# Patient Record
Sex: Male | Born: 1935 | Race: Black or African American | Hispanic: No | Smoking: Never smoker
Health system: Southern US, Community
[De-identification: ages and names within clinical notes are randomized; demographics above are authoritative.]

## PROBLEM LIST (undated history)

## (undated) DIAGNOSIS — I639 Cerebral infarction, unspecified: Secondary | ICD-10-CM

## (undated) DIAGNOSIS — H409 Unspecified glaucoma: Secondary | ICD-10-CM

---

## 1998-01-10 ENCOUNTER — Ambulatory Visit (HOSPITAL_BASED_OUTPATIENT_CLINIC_OR_DEPARTMENT_OTHER): Admission: RE | Admit: 1998-01-10 | Discharge: 1998-01-10 | Payer: Self-pay | Admitting: Orthopedic Surgery

## 1998-12-06 ENCOUNTER — Ambulatory Visit (HOSPITAL_COMMUNITY): Admission: RE | Admit: 1998-12-06 | Discharge: 1998-12-06 | Payer: Self-pay | Admitting: *Deleted

## 1998-12-24 ENCOUNTER — Ambulatory Visit (HOSPITAL_COMMUNITY): Admission: RE | Admit: 1998-12-24 | Discharge: 1998-12-24 | Payer: Self-pay | Admitting: *Deleted

## 1999-01-10 ENCOUNTER — Ambulatory Visit (HOSPITAL_COMMUNITY): Admission: RE | Admit: 1999-01-10 | Discharge: 1999-01-10 | Payer: Self-pay | Admitting: *Deleted

## 2000-03-31 ENCOUNTER — Ambulatory Visit (HOSPITAL_COMMUNITY): Admission: RE | Admit: 2000-03-31 | Discharge: 2000-03-31 | Payer: Self-pay | Admitting: *Deleted

## 2000-03-31 ENCOUNTER — Encounter: Payer: Self-pay | Admitting: *Deleted

## 2000-06-22 ENCOUNTER — Ambulatory Visit (HOSPITAL_COMMUNITY): Admission: RE | Admit: 2000-06-22 | Discharge: 2000-06-22 | Payer: Self-pay | Admitting: Interventional Cardiology

## 2000-06-23 ENCOUNTER — Encounter: Payer: Self-pay | Admitting: Interventional Cardiology

## 2000-06-23 ENCOUNTER — Inpatient Hospital Stay (HOSPITAL_COMMUNITY): Admission: EM | Admit: 2000-06-23 | Discharge: 2000-06-27 | Payer: Self-pay | Admitting: Emergency Medicine

## 2000-06-25 ENCOUNTER — Encounter: Payer: Self-pay | Admitting: Interventional Cardiology

## 2001-11-04 ENCOUNTER — Ambulatory Visit (HOSPITAL_COMMUNITY): Admission: RE | Admit: 2001-11-04 | Discharge: 2001-11-05 | Payer: Self-pay | Admitting: Internal Medicine

## 2002-02-16 ENCOUNTER — Ambulatory Visit (HOSPITAL_COMMUNITY): Admission: RE | Admit: 2002-02-16 | Discharge: 2002-02-16 | Payer: Self-pay | Admitting: Internal Medicine

## 2002-04-05 ENCOUNTER — Encounter: Payer: Self-pay | Admitting: *Deleted

## 2002-04-05 ENCOUNTER — Ambulatory Visit (HOSPITAL_COMMUNITY): Admission: RE | Admit: 2002-04-05 | Discharge: 2002-04-05 | Payer: Self-pay | Admitting: *Deleted

## 2008-06-14 ENCOUNTER — Encounter (INDEPENDENT_AMBULATORY_CARE_PROVIDER_SITE_OTHER): Payer: Self-pay | Admitting: *Deleted

## 2008-06-14 ENCOUNTER — Encounter: Admission: RE | Admit: 2008-06-14 | Discharge: 2008-06-14 | Payer: Self-pay | Admitting: Internal Medicine

## 2009-04-15 ENCOUNTER — Inpatient Hospital Stay (HOSPITAL_COMMUNITY): Admission: EM | Admit: 2009-04-15 | Discharge: 2009-04-22 | Payer: Self-pay | Admitting: Emergency Medicine

## 2009-04-15 ENCOUNTER — Encounter: Admission: RE | Admit: 2009-04-15 | Discharge: 2009-04-15 | Payer: Self-pay | Admitting: Internal Medicine

## 2009-04-16 ENCOUNTER — Encounter (INDEPENDENT_AMBULATORY_CARE_PROVIDER_SITE_OTHER): Payer: Self-pay

## 2009-06-07 ENCOUNTER — Ambulatory Visit (HOSPITAL_COMMUNITY): Admission: RE | Admit: 2009-06-07 | Discharge: 2009-06-07 | Payer: Self-pay | Admitting: Internal Medicine

## 2009-06-10 ENCOUNTER — Inpatient Hospital Stay (HOSPITAL_COMMUNITY): Admission: AD | Admit: 2009-06-10 | Discharge: 2009-06-13 | Payer: Self-pay | Admitting: General Surgery

## 2009-06-10 ENCOUNTER — Encounter: Admission: RE | Admit: 2009-06-10 | Discharge: 2009-06-10 | Payer: Self-pay | Admitting: General Surgery

## 2009-06-10 ENCOUNTER — Encounter (INDEPENDENT_AMBULATORY_CARE_PROVIDER_SITE_OTHER): Payer: Self-pay | Admitting: *Deleted

## 2009-06-12 ENCOUNTER — Encounter (INDEPENDENT_AMBULATORY_CARE_PROVIDER_SITE_OTHER): Payer: Self-pay | Admitting: *Deleted

## 2009-06-13 ENCOUNTER — Encounter (INDEPENDENT_AMBULATORY_CARE_PROVIDER_SITE_OTHER): Payer: Self-pay | Admitting: *Deleted

## 2009-06-28 ENCOUNTER — Ambulatory Visit: Payer: Self-pay | Admitting: Gastroenterology

## 2009-06-28 DIAGNOSIS — K56609 Unspecified intestinal obstruction, unspecified as to partial versus complete obstruction: Secondary | ICD-10-CM | POA: Insufficient documentation

## 2009-06-28 DIAGNOSIS — C61 Malignant neoplasm of prostate: Secondary | ICD-10-CM

## 2009-07-01 ENCOUNTER — Inpatient Hospital Stay (HOSPITAL_COMMUNITY): Admission: EM | Admit: 2009-07-01 | Discharge: 2009-07-03 | Payer: Self-pay | Admitting: Emergency Medicine

## 2009-08-01 ENCOUNTER — Ambulatory Visit (HOSPITAL_COMMUNITY): Admission: RE | Admit: 2009-08-01 | Discharge: 2009-08-01 | Payer: Self-pay | Admitting: General Surgery

## 2010-04-24 IMAGING — CR DG CHEST 2V
2 series · 2 of 2 positions shown · non-contrast
Comparison: 07/02/2009

CLINICAL DATA: Right inguinal hernia, preoperative assessment,
history of hypertension

CHEST - 2 VIEW

[view not recorded (1 of 2)]
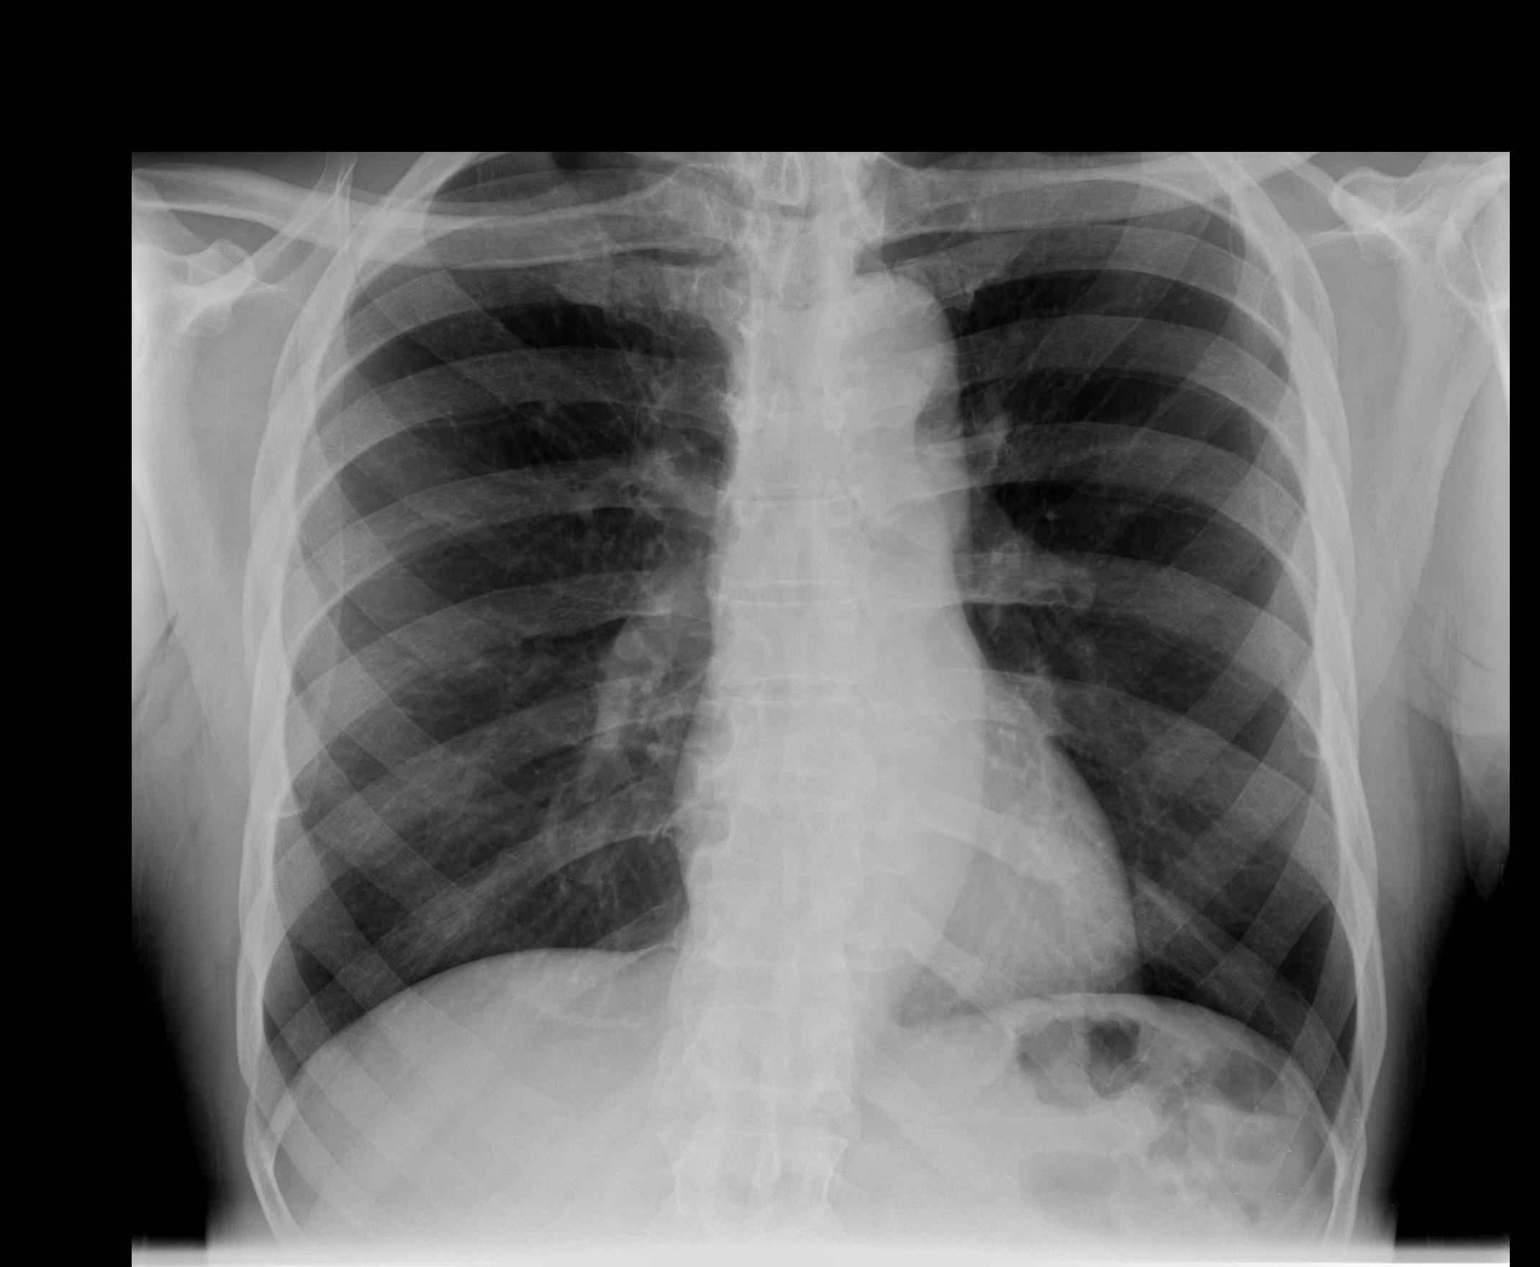

[view not recorded (2 of 2)]
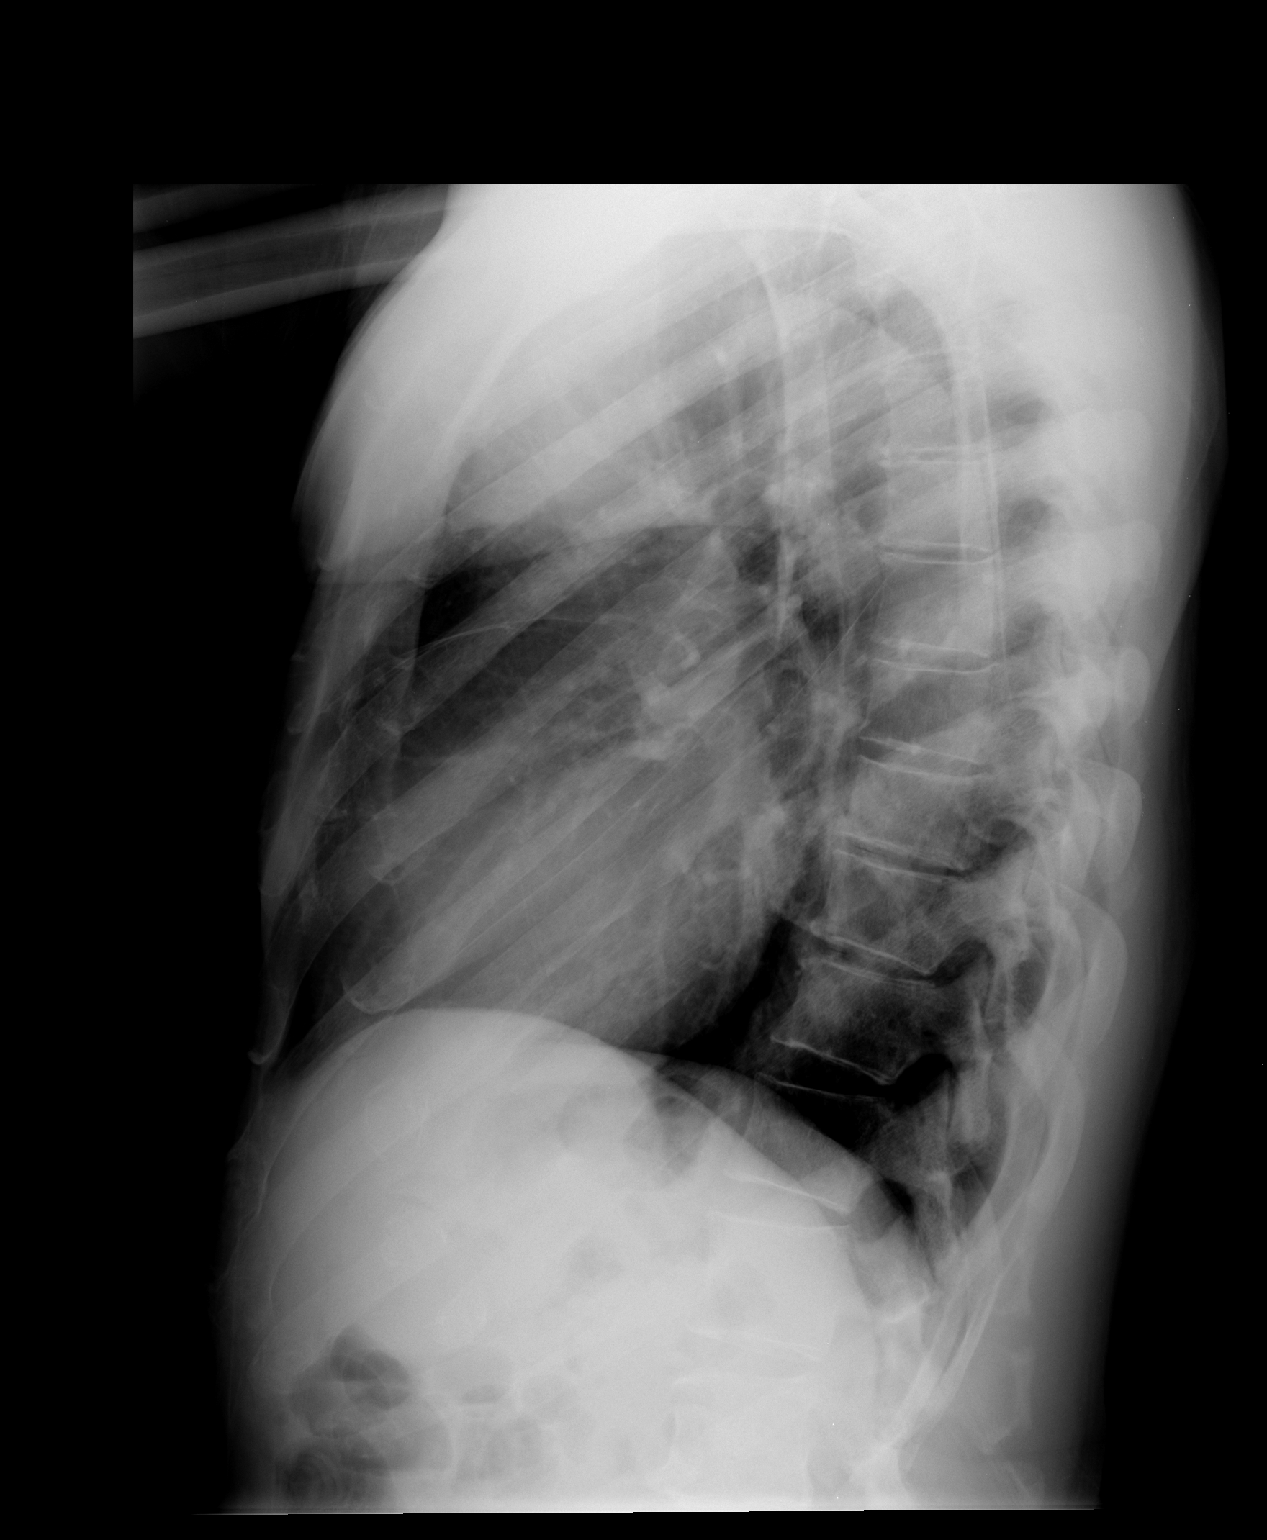

[2 of 2 positions shown; findings below may reference images not displayed]

FINDINGS: Normal heart size and pulmonary vascularity.
Tortuous aorta.
Minimal chronic peribronchial thickening and question underlying
emphysematous changes.
No acute infiltrate or pleural effusion.
No acute bony abnormalities.
IMPRESSION: Chronic bronchitic and probable emphysematous changes, question
COPD.
No acute abnormalities.

## 2010-06-10 NOTE — Letter (Signed)
Summary: New Patient letter  [facility]  [address] [place], [place] [address]   Phone: [phone]  Fax: [phone]       06/10/2009 MRN: [id number]  [facility] [address] [place], [place]  [address]  Dear Mr. Wieck,  Welcome to the Gastroenterology Division at [facility].    You are scheduled to see Dr.  Arlyce Dice on 06-28-09 at 3pm on the 3rd floor at [facility], [address]  We ask that you try to arrive at our office 15 minutes prior to your appointment time to allow for check-in.  We would like you to complete the enclosed self-administered evaluation form prior to your visit and bring it with you on the day of your appointment.  We will review it with you.  Also, please bring a complete list of all your medications or, if you prefer, bring the medication bottles and we will list them.  Please bring your insurance card so that we may make a copy of it.  If your insurance requires a referral to see a specialist, please bring your referral form from your primary care physician.  Co-payments are due at the time of your visit and may be paid by cash, check or credit card.     Your office visit will consist of a consult with your physician (includes a physical exam), any laboratory testing he/she may order, scheduling of any necessary diagnostic testing (e.g. x-ray, ultrasound, CT-scan), and scheduling of a procedure (e.g. Endoscopy, Colonoscopy) if required.  Please allow enough time on your schedule to allow for any/all of these possibilities.    If you cannot keep your appointment, please call [phone] to cancel or reschedule prior to your appointment date.  This allows [place] the opportunity to schedule an appointment for another patient in need of care.  If you do not cancel or reschedule by 5 p.m. the business day prior to your appointment date, you will be charged a $50.00 late cancellation/no-show fee.    Thank you for choosing  [facility] for your medical needs.  We appreciate the opportunity to care for you.  Please visit [place] at our website  to learn more about our practice.                     Sincerely,                                                             The Gastroenterology Division

## 2010-06-10 NOTE — Letter (Signed)
Summary: Results Letter  [facility]  [address] [place], [place] [address]   Phone: [phone]  Fax: [phone]        June 28, 2009 MRN: [id number]    [facility] [address] [place], [place]  [address]    Dear Mario Mendez,  It is my pleasure to have treated you recently as a new patient in my office. I appreciate your confidence and the opportunity to participate in your care.  Since I do have a busy inpatient endoscopy schedule and office schedule, my office hours vary weekly. I am, however, available for emergency calls everyday through my office. If I am not available for an urgent office appointment, another one of our gastroenterologist will be able to assist you.  My well-trained staff are prepared to help you at all times. For emergencies after office hours, a physician from our Gastroenterology section is always available through my 24 hour answering service  Once again I welcome you as a new patient and I look forward to a happy and healthy relationship             Sincerely,  Mario Meckel MD  This letter has been electronically signed by your physician.  Appended Document: Results Letter Letter mailed to patient.

## 2010-06-10 NOTE — Assessment & Plan Note (Signed)
Summary: EPIGASTRIC PAIN/NAUSEA & VOMITTING/YF   History of Present Illness Visit Type: consult  Primary GI MD: Melvia Heaps MD [facility] Primary Provider: Kirby Funk, MD  Requesting Provider: Almond Lint, MD Chief Complaint: Pt was referred for Epigastic pain, nausea and vomiting. Pt states that he is better and denies any GI complaints  History of Present Illness:   Mario Mendez is a pleasant 77 year old Afro-American male referred at the request of Dr.Byerly for GI evaluation.  In December, 2010 he underwent a right hemicolectomy for a complicated  acute appendicitis.  He was hospitalized in early February for a small bowel obstruction.  This was treated medically with resolution.  At this time he has no GI complaints including pain, nausea or vomiting.  He is moving his bowels regularly.   GI Review of Systems    Reports abdominal pain, nausea, and  vomiting.     Location of  Abdominal pain: generalized.    Denies acid reflux, belching, bloating, chest pain, dysphagia with liquids, dysphagia with solids, heartburn, loss of appetite, vomiting blood, weight loss, and  weight gain.      Reports constipation and  diarrhea.     Denies anal fissure, black tarry stools, change in bowel habit, diverticulosis, fecal incontinence, heme positive stool, hemorrhoids, irritable bowel syndrome, jaundice, light color stool, liver problems, rectal bleeding, and  rectal pain.    Current Medications (verified): 1)  Plavix 75 Mg Tabs (Clopidogrel Bisulfate) .... Once Daily 2)  Prilosec Otc 20 Mg Tbec (Omeprazole Magnesium) .... As Needed 3)  Amlodipine Besylate 2.5 Mg Tabs (Amlodipine Besylate) .... One Tablet By Mouth Once Daily 4)  Prednisone 10 Mg Tabs (Prednisone) .... One Tablet By Mouth Once Daily  Allergies (verified): No Known Drug Allergies  Past History:  Past Medical History: Hypertension Arthritis Prostate Cancer---1998  Past Surgical History: Appendectomy Hernia Surgery Colon  Surgery   Family History: No FH of Colon Cancer: Family History of Pancreatic Cancer:Sister Family History of Diabetes: Brother x 2  Family History of Heart Disease: Father   Social History: Occupation: Retired Divorced 2 childern Patient has never smoked.  Alcohol Use - no Illicit Drug Use - no Smoking Status:  never Drug Use:  no  Review of Systems       The patient complains of arthritis/joint pain, itching, and sleeping problems.  The patient denies allergy/sinus, anemia, anxiety-new, back pain, blood in urine, breast changes/lumps, change in vision, confusion, cough, coughing up blood, depression-new, fainting, fatigue, fever, headaches-new, hearing problems, heart murmur, heart rhythm changes, muscle pains/cramps, night sweats, nosebleeds, shortness of breath, skin rash, sore throat, swelling of feet/legs, swollen lymph glands, thirst - excessive, urination - excessive, urination changes/pain, urine leakage, vision changes, and voice change.         All other systems were reviewed and were negative   Vital Signs:  Patient profile:   77 year old male Height:      72 inches Weight:      155 pounds BSA:     1.91 Pulse rate:   64 / minute Pulse rhythm:   regular BP sitting:   128 / 76  (left arm) Cuff size:   regular  Vitals Entered By: Ok Anis CMA (June 28, 2009 3:29 PM)  Physical Exam  Additional Exam:  On physical exam he is an elderly but healthy-appearing male  skin: anicteric HEENT: normocephalic; PEERLA; no nasal or pharyngeal abnormalities neck: supple nodes: no cervical lymphadenopathy chest: clear to ausculatation and percussion heart: no murmurs,  gallops, or rubs abd: soft, nontender; BS normoactive; no abdominal masses, tenderness, organomegaly rectal: deferred ext: no cynanosis, clubbing, edema skeletal: no deformities neuro: oriented x 3; no focal abnormalities    Impression & Recommendations:  Problem # 1:  Hx of SBO  (ICD-560.9) Assessment Improved This has clinically resolved.  No further workup or therapy is required at this time.  Problem # 2:  ADENOCARCINOMA, PROSTATE (ICD-185) Assessment: Comment Only  Patient Instructions: 1)  CC Dr.Faera Byerly 2)  cc Dr. Kirby Funk

## 2010-06-10 NOTE — Discharge Summary (Signed)
Mario Mendez, Mario Mendez NO.:  [id number]      MEDICAL RECORD NO.:  [id number]          PATIENT TYPE:  INP      LOCATION:  5155                         FACILITY:  [facility]      PHYSICIAN:  Abigail Miyamoto, M.D. DATE OF BIRTH:  1923/08/24      DATE OF ADMISSION:  06/10/2009   DATE OF DISCHARGE:  06/13/2009                                  DISCHARGE SUMMARY      REASON FOR ADMISSION:  [name] is an 77 year old male who is status   post laparoscopic converted to open right hemicolectomy for severe   complex appendicitis in December 2010.  He has been doing well   postoperatively until he developed some severe episodic epigastric   abdominal pain that was crampy in nature and subsequent nausea and   vomiting in approximately a week ago.  He was unable to keep anything   down and presented to the emergency department where he was found to   have a small-bowel obstruction.  Please see admitting history and   physical for further details.      ADMITTING DIAGNOSES:   1. Small bowel obstruction status post laparoscopic converted to right       hemicolectomy.   2. History of mini strokes.   3. Hypertension.      HOSPITAL COURSE:  At this time, the patient was admitted, made n.p.o.   and an NG tube was placed.  The following day the patient was feeling   much better.  He was still not passing any flatus but his NG tube was   kept in place.  The following day, the patient was doing well.  He was   passing flatus and has had a bowel movement.  His abdomen was soft,   nondistended and nontender.  His NG tube was discontinued and his diet   was advanced.  The following day, the patient was tolerating full   liquids and his diet was advanced to a regular diet and the patient was   then felt stable for discharge home.      DISCHARGE DIAGNOSES:   1. Small bowel obstruction resolved.   2. Status post laparoscopic converted to right hemicolectomy secondary       to  appendicitis.   3. History of mini strokes.   4. Hypertension.      DISCHARGE MEDICATIONS:  The patient may resume all home medications   including:   1. Amlodipine 2.5 mg daily.   2. Plavix 75 mg daily.      He is not given any new prescriptions.      DISCHARGE INSTRUCTIONS:  The patient has no activity restrictions and no   dietary restrictions.  He is informed to follow up with Dr. Donell Beers in   our office on an as-needed basis.  Otherwise, he is to follow up with   his primary care physician as needed.               Letha Cape, PA         ______________________________  Abigail Miyamoto, M.D.         KEO/MEDQ  D:  06/13/2009  T:  06/13/2009  Job:  [id number]      cc:   Almond Lint, MD

## 2010-07-27 LAB — CBC
Hemoglobin: 13 g/dL (ref 13.0–17.0)
Platelets: 195 10*3/uL (ref 150–400)
RBC: 4.11 MIL/uL — ABNORMAL LOW (ref 4.22–5.81)
WBC: 4.8 10*3/uL (ref 4.0–10.5)

## 2010-07-27 LAB — BASIC METABOLIC PANEL
CO2: 35 mEq/L — ABNORMAL HIGH (ref 19–32)
Calcium: 9.7 mg/dL (ref 8.4–10.5)
Creatinine, Ser: 1.31 mg/dL (ref 0.4–1.5)
GFR calc Af Amer: >60 mL/min (ref >60)
GFR calc non Af Amer: 52 mL/min — ABNORMAL LOW (ref >60)

## 2010-07-30 LAB — BASIC METABOLIC PANEL
BUN: 10 mg/dL (ref 6–23)
BUN: 11 mg/dL (ref 6–23)
CO2: 28 mEq/L (ref 19–32)
CO2: 30 mEq/L (ref 19–32)
CO2: 31 mEq/L (ref 19–32)
Chloride: 102 mEq/L (ref 96–112)
Chloride: 109 mEq/L (ref 96–112)
Creatinine, Ser: 1.21 mg/dL (ref 0.4–1.5)
Creatinine, Ser: 1.22 mg/dL (ref 0.4–1.5)
GFR calc Af Amer: >60 mL/min (ref >60)
GFR calc non Af Amer: 57 mL/min — ABNORMAL LOW (ref >60)
GFR calc non Af Amer: 57 mL/min — ABNORMAL LOW (ref >60)
GFR calc non Af Amer: >60 mL/min (ref >60)
Glucose, Bld: 124 mg/dL — ABNORMAL HIGH (ref 70–99)
Potassium: 3.6 mEq/L (ref 3.5–5.1)
Sodium: 134 mEq/L — ABNORMAL LOW (ref 135–145)

## 2010-07-30 LAB — URINALYSIS, ROUTINE W REFLEX MICROSCOPIC
Bilirubin Urine: NEGATIVE
Ketones, ur: 15 mg/dL — AB
Nitrite: NEGATIVE
Protein, ur: NEGATIVE mg/dL
Specific Gravity, Urine: 1.024 (ref 1.005–1.030)
Urobilinogen, UA: 0.2 mg/dL (ref 0.0–1.0)

## 2010-07-30 LAB — CBC
HCT: 36.1 % — ABNORMAL LOW (ref 39.0–52.0)
Hemoglobin: 11.5 g/dL — ABNORMAL LOW (ref 13.0–17.0)
MCHC: 33.8 g/dL (ref 30.0–36.0)
MCHC: 33.8 g/dL (ref 30.0–36.0)
MCHC: 34.2 g/dL (ref 30.0–36.0)
MCV: 96 fL (ref 78.0–100.0)
Platelets: 160 10*3/uL (ref 150–400)
Platelets: 169 10*3/uL (ref 150–400)
RBC: 3.53 MIL/uL — ABNORMAL LOW (ref 4.22–5.81)
RBC: 3.54 MIL/uL — ABNORMAL LOW (ref 4.22–5.81)
RBC: 3.56 MIL/uL — ABNORMAL LOW (ref 4.22–5.81)
RDW: 13.7 % (ref 11.5–15.5)
RDW: 13.9 % (ref 11.5–15.5)

## 2010-07-30 LAB — DIFFERENTIAL
Eosinophils Relative: 6 % — ABNORMAL HIGH (ref 0–5)
Lymphocytes Relative: 14 % (ref 12–46)
Lymphocytes Relative: 25 % (ref 12–46)
Lymphs Abs: 1 10*3/uL (ref 0.7–4.0)
Monocytes Absolute: 0.2 10*3/uL (ref 0.1–1.0)
Monocytes Absolute: 0.4 10*3/uL (ref 0.1–1.0)
Monocytes Relative: 5 % (ref 3–12)

## 2010-07-30 LAB — COMPREHENSIVE METABOLIC PANEL
ALT: 19 U/L (ref 0–53)
AST: 36 U/L (ref 0–37)
Alkaline Phosphatase: 50 U/L (ref 39–117)
CO2: 30 mEq/L (ref 19–32)
GFR calc Af Amer: >60 mL/min (ref >60)
GFR calc non Af Amer: >60 mL/min (ref >60)
Glucose, Bld: 114 mg/dL — ABNORMAL HIGH (ref 70–99)
Potassium: 4 mEq/L (ref 3.5–5.1)
Sodium: 135 mEq/L (ref 135–145)

## 2010-08-01 LAB — BASIC METABOLIC PANEL
BUN: 14 mg/dL (ref 6–23)
CO2: 30 mEq/L (ref 19–32)
Chloride: 103 mEq/L (ref 96–112)
Creatinine, Ser: 0.96 mg/dL (ref 0.4–1.5)

## 2010-08-01 LAB — CBC
HCT: 36.4 % — ABNORMAL LOW (ref 39.0–52.0)
MCHC: 34 g/dL (ref 30.0–36.0)
MCV: 95.2 fL (ref 78.0–100.0)
Platelets: 170 10*3/uL (ref 150–400)
RBC: 3.82 MIL/uL — ABNORMAL LOW (ref 4.22–5.81)

## 2010-08-01 LAB — PROTIME-INR
INR: 0.99 (ref 0.00–1.49)
Prothrombin Time: 15.2 seconds (ref 11.6–15.2)

## 2010-08-12 LAB — HEPATIC FUNCTION PANEL
ALT: 20 U/L (ref 0–53)
Alkaline Phosphatase: 49 U/L (ref 39–117)
Bilirubin, Direct: 0.3 mg/dL (ref 0.0–0.3)
Indirect Bilirubin: 2.7 mg/dL — ABNORMAL HIGH (ref 0.3–0.9)
Total Protein: 6.2 g/dL (ref 6.0–8.3)

## 2010-08-12 LAB — CBC
HCT: 36.3 % — ABNORMAL LOW (ref 39.0–52.0)
Hemoglobin: 12.2 g/dL — ABNORMAL LOW (ref 13.0–17.0)
MCHC: 34.1 g/dL (ref 30.0–36.0)
MCHC: 34.3 g/dL (ref 30.0–36.0)
MCV: 94.3 fL (ref 78.0–100.0)
MCV: 95.2 fL (ref 78.0–100.0)
Platelets: 126 10*3/uL — ABNORMAL LOW (ref 150–400)
Platelets: 130 10*3/uL — ABNORMAL LOW (ref 150–400)
Platelets: 136 10*3/uL — ABNORMAL LOW (ref 150–400)
RBC: 3.76 MIL/uL — ABNORMAL LOW (ref 4.22–5.81)
RBC: 3.77 MIL/uL — ABNORMAL LOW (ref 4.22–5.81)
RDW: 13.3 % (ref 11.5–15.5)
RDW: 13.5 % (ref 11.5–15.5)
WBC: 10.2 10*3/uL (ref 4.0–10.5)
WBC: 12.2 10*3/uL — ABNORMAL HIGH (ref 4.0–10.5)
WBC: 16.3 10*3/uL — ABNORMAL HIGH (ref 4.0–10.5)
WBC: 16.7 10*3/uL — ABNORMAL HIGH (ref 4.0–10.5)

## 2010-08-12 LAB — URINALYSIS, ROUTINE W REFLEX MICROSCOPIC
Glucose, UA: NEGATIVE mg/dL
Leukocytes, UA: NEGATIVE
Protein, ur: NEGATIVE mg/dL
Specific Gravity, Urine: 1.04 — ABNORMAL HIGH (ref 1.005–1.030)
pH: 5.5 (ref 5.0–8.0)

## 2010-08-12 LAB — BASIC METABOLIC PANEL
BUN: 14 mg/dL (ref 6–23)
BUN: 17 mg/dL (ref 6–23)
BUN: 7 mg/dL (ref 6–23)
CO2: 30 mEq/L (ref 19–32)
Calcium: 8.3 mg/dL — ABNORMAL LOW (ref 8.4–10.5)
Calcium: 8.8 mg/dL (ref 8.4–10.5)
Chloride: 102 mEq/L (ref 96–112)
Creatinine, Ser: 0.99 mg/dL (ref 0.4–1.5)
Creatinine, Ser: 1.18 mg/dL (ref 0.4–1.5)
Creatinine, Ser: 1.22 mg/dL (ref 0.4–1.5)
GFR calc Af Amer: >60 mL/min (ref >60)
GFR calc non Af Amer: >60 mL/min (ref >60)
Glucose, Bld: 122 mg/dL — ABNORMAL HIGH (ref 70–99)
Glucose, Bld: 158 mg/dL — ABNORMAL HIGH (ref 70–99)
Potassium: 3.7 mEq/L (ref 3.5–5.1)

## 2010-08-12 LAB — DIFFERENTIAL
Basophils Relative: 1 % (ref 0–1)
Lymphs Abs: 1 10*3/uL (ref 0.7–4.0)
Monocytes Absolute: 1.1 10*3/uL — ABNORMAL HIGH (ref 0.1–1.0)
Monocytes Relative: 7 % (ref 3–12)
Neutro Abs: 14.1 10*3/uL — ABNORMAL HIGH (ref 1.7–7.7)

## 2010-08-12 LAB — URINE MICROSCOPIC-ADD ON

## 2010-08-12 LAB — TYPE AND SCREEN: ABO/RH(D): O POS

## 2010-08-12 LAB — ABO/RH: ABO/RH(D): O POS

## 2010-08-12 LAB — MAGNESIUM: Magnesium: 1.9 mg/dL (ref 1.5–2.5)

## 2010-08-12 LAB — PROTIME-INR
INR: 1.25 (ref 0.00–1.49)
Prothrombin Time: 15.6 seconds — ABNORMAL HIGH (ref 11.6–15.2)

## 2010-08-12 LAB — TSH: TSH: 2.391 u[IU]/mL (ref 0.350–4.500)

## 2010-09-26 NOTE — Op Note (Signed)
[name], [name]                         ACCOUNT NO.:  [id number]   MEDICAL RECORD NO.:  [id number]                   PATIENT TYPE:  OIB   LOCATION:  NA                                   FACILITY:  [facility]   PHYSICIAN:  Mario Mendez, M.D. [facility]           DATE OF BIRTH:  Dec 26, 1923   DATE OF PROCEDURE:  02/15/2002  DATE OF DISCHARGE:                                 OPERATIVE REPORT   PREOPERATIVE DIAGNOSES:  Prior AV nodal reentry tachycardia, slow pathway  modification with recurrent palpitations and documented tachycardia.   POSTOPERATIVE DIAGNOSES:  No inducible, no evidence of antegrade slow  pathway function, inducible nonsustained atrial flutter, atrial fibrillation  and evidence of accelerated junctional rhythm.   PROCEDURE:  EPS study.   SURGEON:  Mario Mendez, M.D. [facility]   DESCRIPTION OF PROCEDURE:  The patient was brought to the electrophysiology  laboratory and placed on the fluoroscopic in the supine position.  After  routine prep and drape, cardiac catheterization was performed with local  anesthesia and conscious sedation.  Noninvasive blood pressure monitoring  and transcutaneous oxygen saturation monitoring and end tidal CO2 monitoring  were performed continuously throughout the procedure.  Following the  procedure, catheters were removed and hemostasis was obtained.  The patient  was transferred to the floor in stable condition.   Catheters were a 5 French quadripolar catheter inserted via the left femoral  vein to the high right atrium.  A 5 French quadripolar catheter inserted via  the left femoral vein to the AV junction.  A 5 French quadripolar catheter  was inserted to the left femoral vein to the right ventricular apex.  A 5  French octapolar catheter was inserted via the right femoral vein to the  coronary sinus.  A 7 French dual decapolar catheter was inserted via left  femoral vein to the tricuspid annulus.  Surface leads 1, AV, F and V1 were  monitored continuously throughout the procedure.  Following insertion of the  catheters, the stimulation protocol included incremental atrial pacing.  Incremental ventricular pacing.  Single atrial extra stimuli at paced cycle length of 600 and double atrial  extrastimuli paced cycle length of 400 and 450.  Burst atrial pacing down to 200 ms both in the right atrium as well as from  the coronary sinus.   RESULTS:  Electrocautery.  Rhythm is sinus.  Cycle length is 1124 ms.  PR  interval is 174 ms.  QRS duration is 106 ms.  QT interval is 475 ms, P wave  inversion is 113 ms.  Bundle branch block was absent.  Pre-excitation was  absent.   AV nodal function.  At the baseline AH interval is 104 ms with AV Wenckebach  600 ms, VA connection was dissociated.  In the presence of isoproterenol at  0.3 mcg per minute, AV Wenckebach was less than 370 ms.  VA conduction  remained dissociated with no evidence  of slow path antegrade conduction.   There was no evidence of accessory pathway function.   His Purkinje.  Baseline AH interval is 34 ms.   Arrhythmias induced.  Atrial flutter with cycle length of 260 ms was induced  on one occasion.  Atrial fibrillation and junctional atrial fibrillation was  also reproducibly induced.  Both of these were nonsustained.  Accelerated  junctional rhythm was seen with initiation of isoproterenol.  No sustained  supraventricular rhythms were inducible.   IMPRESSION:  The results of electrophysiologic testing failed to identify a  mechanism underlying the rhythm that was seen on the event recorder.  Potential explanations could include an accelerated junctional rhythm, sinus  tachycardia, or an atrial flutter that happened to sustain at that time.   Given his relative bradycardia and the relatively mild symptoms associated  with this, it may be prudent to undertake no specific therapy at this point.  I will defer that to Dr. Katrinka Mendez.                                                  Mario Mendez, M.D. [facility]    SCK/MEDQ  D:  02/16/2002  T:  02/16/2002  Job:  [id number]   cc:   Mario Mendez, M.D.  [address]  [address]  [place]  [place] [address]  Fax: [phone]   Electrophysiology Laboratory

## 2010-09-26 NOTE — Cardiovascular Report (Signed)
[place]. [facility]  Patient:    Mario Mendez, Mario Mendez                      MRN: [id number] Proc. Date: 06/22/00 Adm. Date:  [id number] Attending:  Lyn Records. Iii CC:         Sharyn Dross., M.D.   Cardiac Catheterization  INDICATIONS FOR PROCEDURE:  Recurrent episodes of upper chest and neck discomfort occurring during exertion and spontaneously.  Previous Cardiolite study revealed slight reduction in left ventricular function with ejection fraction of 44% and an inferior wall defect that was felt to be diaphragmatic attenuation, but prior inferior infarction cannot be totally excluded.  The patient has also had palpitations and we are trying to distinguish between coronary artery disease with ischemia-induced arrhythmias versus an intrensic rhythm problem such as tachycardia-bradycardia syndrome.  PROCEDURES PERFORMED: 1. Left heart catheterization. 2. Selective coronary angiography. 3. Left ventriculography.  DESCRIPTION OF PROCEDURE:  After informed consent, a 6 French sheath was inserted into the right femoral artery.  We noticed significant iliac tortuosity with attempts to place the right femoral sheath.  We initially started with a 6 [place] A-2 multipurpose catheter.  We advanced this catheter over the guidewire to the aortic arch.  We were unable to torque this catheter because of limitations caused by the right iliac tortuosity.  We then removed this catheter and the standard length sheath and placed a long sheath that reached to the aortic bifurcation.  We then used Judkins, both the #4 6 French left and right catheter and a pigtail catheter to perform coronary angiography and left ventriculography.  The patient tolerated the procedure without significant complications.  Prior to pulling the sheath, we did do a right iliac/femoral sheathogram that did not demonstrate any dissruption nor evidence of damage to the right  iliac.  RESULTS:  I:  HEMODYNAMIC DATA:     a. The aortic pressure was 167/54 mmHg.     b. Left ventricular pressure 164/13 mmHg.  II:  LEFT VENTRICULOGRAPHY:  The left ventricle is normal in size and demonstrates normal overall contractility.  No mitral regurgitation is noted.  III:  SELECTIVE CORONARY ANGIOGRAPHY:     a. Left main:  The left main coronary artery is normal.     b. Left anterior descending coronary artery:  The LAD is a large        but very tortuous vessel.  Minimal luminal irregularity is noted        in the mid vessel.  The LAD gives origin to several large diagonal        branches that are normal.  There is a kink in the distal LAD.     c. Circumflex artery:  The circumflex is large and dominant.  It gives        origin to three obtuse marginal branches and a distal bifurcating        posterior descending branch.  The circumflex system is also entirely        normal.     d. Right coronary artery:  The right coronary artery is small, nondominant        and free of any significant obstruction.  IV:  RIGHT ILIAC SHEATHOGRAM:  The right iliac and femoral region is free of any significant obstruction.  There is marked tortuosity.  There was no evidence of dissruption or any flow disturbance prior to sheath removal.  CONCLUSIONS: 1. Normal coronary arteries. 2. Normal  left ventricular function. 3. Very tortuous peripheral and coronary arterial circulations.  RECOMMENDATIONS:  Loop Recorder. DD:  06/22/00 TD:  06/22/00 Job: [id number]

## 2010-09-26 NOTE — Discharge Summary (Signed)
[place]. [facility]  Patient:    Mario Mendez, Mario Mendez                      MRN: [id number] Adm. Date:  [id number] Disc. Date: [id number] Attending:  Lyn Records. Iii Dictator:   Anselm Lis, N.P. CC:         Deanna Artis. Sharene Skeans, M.D.  Sharyn Dross., M.D.   Discharge Summary  DATE OF BIRTH:  11/03/1923  CONSULTANTS:  Deanna Artis. Sharene Skeans, M.D., of neurologic group.  PRIMARY CARE PHYSICIAN:  Sharyn Dross., M.D.  PROCEDURES: 1. 2-D echocardiogram revealing EF 50-55% without regional wall motion    abnormalities.  No intracardiac source of emboli seen. 2. (June 23, 2000) chest CT without contrast revealing evidence of    chronic microvascular disease.  No acute intracranial abnormality. 3. (June 25, 2000) magnetic resonance of the brain and magnetic resonance    angiography of the head without contrast revealing acute infarct, 1 cm, in    the left parietal white matter.  No intracranial stenosis or vascular    malformation identified. 4. (June 24, 2000) carotid Doppler ultrasound which was negative for    bilateral internal carotid artery stenosis.  DISCHARGE DIAGNOSES: 1. Left brain subcortical lacunar stroke.  More likely a thrombotic rather    than embolic lesion as magnetic resonance angiography revealed mild    intracranial atherosclerosis without occlusions.  The patient was    discharged home on aspirin and Plavix per stroke team recommendation.    Residual effect of stroke with droop of the right side of his mouth. 2. History of palpitations:  No evidence of atrial fibrillation nor flutter    this admission.  PLAN:  The patient was discharged home in stable condition.  DISCHARGE MEDICATIONS:  Plavix 75 mg one p.o. q.d.  ACTIVITY:  As tolerated:  Light, no heavy exercise.  DIET:  Low fat, low salt.  FOLLOW-UP:  The patient is to call and see Darci Needle, M.D., in follow-up in 7-10 days post discharge.  BRIEF HOSPITAL  COURSE:  (Please also see the dictation.)  Mario Mendez is a pleasant 77 year old gentleman who was admitted to the hospital one day post outpatient coronary angiography (negative for CAD; normal LV function). The patient presented to the clinic the day post catheterization with complaint of asymmetry of his mouth and droop of the right side of his mouth with some slurring in his speech.  He was admitted to the hospital to rule out an embolic source of stroke.  He was admitted and initiated on heparin with telemetry monitoring for evidence of atrial fibrillation.  A CT scan revealed small vessel disease without acute lesions.  A 2-D echocardiogram was negative for intracardiac source of emboli.  Carotid Doppler ultrasound was negative for internal carotid artery stenosis.  Deanna Artis. Hickling, M.D., of neurology was consulted; felt on the basis of MRI/MRA results that this was more likely a thrombotic rather than embolic lesion.  He suggested that if cardiology was concerned about atrial arrhythmia to continue Coumadin.  If not, then it was okay to taper his Coumadin to off and start aspirin/Plavix.  A lipid profile was obtained for secondary prevention, as well as homocystine level.  Those return in acceptable range.  The patients facial droop and slight slurred speech remained stable during the course of admission.  He was ambulating up and about in the hallways and even jogging in the hallways without  problems. He had no evidence of tachycardia or palpitations during the course of the admission.  He was discharged home in stable condition with the addition of Plavix.  LABORATORY TESTS AND DATA:  The homocystine level was within normal range at 10.53.  Sodium 139, K 3.8, chloride 105, CO2 24, glucose 87, BUN 20, creatinine 1.3.  LFTs within normal range, though AST slightly elevated at 67. Admission coagulation times were within normal range.  WBC 4.2, hemoglobin 13.4, hematocrit  39.2, platelets 183.  CK 127 with MB fraction 2.2. Cholesterol profile:  Cholesterol 173, triglycerides 62, HDL 76, LDL 85.  PREVIOUS MEDICAL HISTORY: 1. Palpitations; event monitor in the summer of 2000 revealing one very brief    episode of supraventricular tachycardia felt to be either junctional or    atrial flutter. 2. Chest discomfort (June 22, 2000).  Coronary angiography revealing    normal coronary arteries and normal LV function. 3. Lumbar disk disease. 4. Status post bilateral venous stripping. 5. History of rotator cuff surgery.  The total time of preparing this discharge was greater than 30 minutes, including preparation of discharge summary, dictating this discharge summary report, and reviewing information with the patient. DD:  08/04/00 TD:  08/05/00 Job: [id number]

## 2010-09-26 NOTE — Discharge Summary (Signed)
[place]. [facility]  Patient:    Mario Mendez, Mario Mendez Visit Number: [id number] MRN: [id number]          Service Type: CAT Location: [id number] Attending Physician:  Nathen May Dictated by:   Delton See, P.A. Admit Date:  11/04/2001 Discharge Date: 11/05/2001   CC:         Darci Needle, M.D.   Discharge Summary  DATE OF BIRTH: [birth year]  HISTORY OF PRESENT ILLNESS: Mario Mendez is a pleasant 77 year old male admitted to [place]. [facility] on November 04, 2001 by Dr. Graciela Husbands for ablation secondary to recurrent supraventricular tachycardia.  He also has a history of hypertension.  He has a history of a CVA related to a previous cardiac catheterization.  PAST MEDICAL HISTORY: Please see above.  ALLERGIES: No known drug allergies.  ADMISSION MEDICATIONS:  1. Plavix.  2. Tiazac 240 mg q.d.  3. Lanoxin, dose not available.  4. Hydrochlorothiazide.  HOSPITAL COURSE: As noted, this patient was admitted to [facility] on November 04, 2001 for ablation secondary to recurrent supraventricular tachycardia.  The ablation was performed by Dr. Graciela Husbands on the day of admission. It was felt to be successful.  The patient tolerated the procedure well.  It was recommended that he discontinue his Lanoxin.  Arrangements were made to discharge the patient in stable and improved condition the following day.  LABORATORY DATA: There were no laboratories during this admission.  DISCHARGE MEDICATIONS:  1. Plavix 75 mg q.d.  2. Maxzide 1/2 tablet q.d.  The patient was given a prescription for this     medication.  3. Tiazac 240 mg q.d.  4. The patient was told not to take Lanoxin or digoxin any longer.  5. The patient was told to take Tylenol as needed for pain.  DISCHARGE ACTIVITY: He was not to do anything strenuous for the next two to three days.  DISCHARGE DIET: He was to remain on a low-salt/low-fat diet.  He is to  call the office if he has any pain, swelling, or bleeding from his groin area.  He is to follow up with Dr. Graciela Husbands as needed, Dr. Tad Moore, his primary physician, as needed or as scheduled, and Dr. Garnette Scheuermann.  The patient was told to call Dr. Lonn Georgia office Monday for an appointment in 10-14 days.  DISCHARGE PROBLEM LIST:  1. Recurrent supraventricular tachycardia, status post radiofrequency     ablation performed November 04, 2001 by Dr. Graciela Husbands.  2. History of hypertension.  3. History of cerebrovascular accident post cardiac catheterization. Dictated by:   Delton See, P.A. Attending Physician:  Nathen May DD:  11/05/01 TD:  11/07/01 Job: [id number]

## 2010-09-26 NOTE — H&P (Signed)
[place]  Patient:    Mario Mendez, Mario Mendez                      MRN: [id number] Adm. Date:  [id number] Attending:  Lyn Records. Iii CC:         Sharyn Dross., M.D.   History and Physical  REASON FOR ADMISSION:  Right facial droop.  HISTORY OF PRESENT ILLNESS:  The patient is a 77 year old male and has a history of intermittent palpitations since the summer of 2000.  He underwent an event monitoring at that time with numerous hours of monitoring, yielding only one episode of a very brief supraventricular tachycardia, that was felt to either be junctional or atrial flutter.  His complaints, however, at times when he was not monitored have been of frequent palpitations that may last anywhere from several seconds to several minutes, that cause him to have chest discomfort and dyspnea.  Since 2000, these episodes have decreased in frequency until the past two to four weeks.  Over this time, they had recurred.  The chest discomfort was more significant.  We felt that a coronary angiography was necessary to totally exclude the possibility of coronary artery disease.  He underwent a coronary angiography on June 22, 2000, which demonstrated normal coronary arteries, and normal left ventricular function.  When he left the day hospital, he had no complaints.  There was good groin hemostasis.  When he awakened on the morning of admission, he noticed asymmetry of his mouth with a droop of the right side of his mouth.  He also felt that there was some slurring in his speech.  He came to my office where he was evaluated, and indeed I felt that there was some slight facial asymmetry, most noticeable in his mouth region, and that there was indeed some slurred speech.  He is admitted to the hospital for further evaluation, to rule out an embolic CVA, possibly a claudication of his calf, or as a complication of paroxysmal supraventricular arrhythmias that  have yet to be identified.  ALLERGIES:  ASPIRIN causes stomach upset.  CURRENT MEDICATIONS:  None.  PAST MEDICAL HISTORY: 1. Lumbar disk disease. 2. Status post bilateral vein stripping. 3. History of rotator cuff surgery.  REVIEW OF SYSTEMS:  Low back discomfort.  Intermittent chest pounding.  No syncope.  Denies headaches.  PHYSICAL EXAMINATION:  VITAL SIGNS:  Blood pressure 172/70, heart rate 60, respirations 16 and unlabored.  SKIN:  Clear, no nail bed cyanosis is noted.  HEENT:  Pupils equal, reactive to light.  Right corner of the mouth is somewhat droopy.  CHEST:  Clear.  CARDIAC:  No murmur, click, rub, or gallop.  ABDOMEN:  Soft, no bruits.  EXTREMITIES:  No edema.  Pulses are 2+ and symmetrical in the upper and lower extremities.  NEUROLOGIC:  Reveals facial asymmetry with the corner of his mouth on the right down-turned slightly.  No other focal neurological deficits.  There is perhaps some slurring of speech.  ASSESSMENT: 1. Left brain transient ischemic attack, versus embolic cerebrovascular    accident.  Rule out other. 2. Palpitations, rule out atrial fibrillation and/or tachycardia/bradycardia    syndrome. 3. Intermittent chest discomfort, probably arrhythmia-related.  Recently    documented to have clean coronaries on June 22, 2000.  PLAN: 1. Admit to the hospital. 2. IV heparin. 3. CT or MRI. 4. Neurological consultation. 5. A 2-D echocardiogram and Doppler evaluation of the carotids. 6. Monitor for  atrial fibrillation. DD:  06/24/00 TD:  06/24/00 Job: [id number]

## 2011-07-30 DIAGNOSIS — I10 Essential (primary) hypertension: Secondary | ICD-10-CM | POA: Diagnosis not present

## 2011-09-04 DIAGNOSIS — Z8546 Personal history of malignant neoplasm of prostate: Secondary | ICD-10-CM | POA: Diagnosis not present

## 2011-09-10 DIAGNOSIS — Z8546 Personal history of malignant neoplasm of prostate: Secondary | ICD-10-CM | POA: Diagnosis not present

## 2011-09-10 DIAGNOSIS — N390 Urinary tract infection, site not specified: Secondary | ICD-10-CM | POA: Diagnosis not present

## 2011-09-17 DIAGNOSIS — H04129 Dry eye syndrome of unspecified lacrimal gland: Secondary | ICD-10-CM | POA: Diagnosis not present

## 2011-09-17 DIAGNOSIS — H3530 Unspecified macular degeneration: Secondary | ICD-10-CM | POA: Diagnosis not present

## 2011-09-17 DIAGNOSIS — H40029 Open angle with borderline findings, high risk, unspecified eye: Secondary | ICD-10-CM | POA: Diagnosis not present

## 2011-09-17 DIAGNOSIS — H2510 Age-related nuclear cataract, unspecified eye: Secondary | ICD-10-CM | POA: Diagnosis not present

## 2012-01-26 DIAGNOSIS — I10 Essential (primary) hypertension: Secondary | ICD-10-CM | POA: Diagnosis not present

## 2012-03-17 DIAGNOSIS — I10 Essential (primary) hypertension: Secondary | ICD-10-CM | POA: Diagnosis not present

## 2012-03-17 DIAGNOSIS — Z23 Encounter for immunization: Secondary | ICD-10-CM | POA: Diagnosis not present

## 2012-08-01 DIAGNOSIS — Z1331 Encounter for screening for depression: Secondary | ICD-10-CM | POA: Diagnosis not present

## 2012-08-01 DIAGNOSIS — Z0000 Encounter for general adult medical examination without abnormal findings: Secondary | ICD-10-CM | POA: Diagnosis not present

## 2012-08-01 DIAGNOSIS — R5381 Other malaise: Secondary | ICD-10-CM | POA: Diagnosis not present

## 2012-09-05 DIAGNOSIS — Z8546 Personal history of malignant neoplasm of prostate: Secondary | ICD-10-CM | POA: Diagnosis not present

## 2012-09-12 DIAGNOSIS — Z8546 Personal history of malignant neoplasm of prostate: Secondary | ICD-10-CM | POA: Diagnosis not present

## 2012-10-07 DIAGNOSIS — H04129 Dry eye syndrome of unspecified lacrimal gland: Secondary | ICD-10-CM | POA: Diagnosis not present

## 2012-10-07 DIAGNOSIS — H3530 Unspecified macular degeneration: Secondary | ICD-10-CM | POA: Diagnosis not present

## 2012-10-07 DIAGNOSIS — H40029 Open angle with borderline findings, high risk, unspecified eye: Secondary | ICD-10-CM | POA: Diagnosis not present

## 2012-10-07 DIAGNOSIS — H2510 Age-related nuclear cataract, unspecified eye: Secondary | ICD-10-CM | POA: Diagnosis not present

## 2013-02-24 DIAGNOSIS — Z23 Encounter for immunization: Secondary | ICD-10-CM | POA: Diagnosis not present

## 2013-08-02 DIAGNOSIS — Z23 Encounter for immunization: Secondary | ICD-10-CM | POA: Diagnosis not present

## 2013-08-02 DIAGNOSIS — Z1331 Encounter for screening for depression: Secondary | ICD-10-CM | POA: Diagnosis not present

## 2013-08-02 DIAGNOSIS — I10 Essential (primary) hypertension: Secondary | ICD-10-CM | POA: Diagnosis not present

## 2013-09-08 DIAGNOSIS — Z8546 Personal history of malignant neoplasm of prostate: Secondary | ICD-10-CM | POA: Diagnosis not present

## 2013-09-14 DIAGNOSIS — Z8546 Personal history of malignant neoplasm of prostate: Secondary | ICD-10-CM | POA: Diagnosis not present

## 2013-10-17 DIAGNOSIS — H353190 Nonexudative age-related macular degeneration, unspecified eye, stage unspecified: Secondary | ICD-10-CM | POA: Diagnosis not present

## 2013-10-17 DIAGNOSIS — H2589 Other age-related cataract: Secondary | ICD-10-CM | POA: Diagnosis not present

## 2013-10-17 DIAGNOSIS — H04129 Dry eye syndrome of unspecified lacrimal gland: Secondary | ICD-10-CM | POA: Diagnosis not present

## 2013-10-17 DIAGNOSIS — H40019 Open angle with borderline findings, low risk, unspecified eye: Secondary | ICD-10-CM | POA: Diagnosis not present

## 2013-10-17 DIAGNOSIS — H1045 Other chronic allergic conjunctivitis: Secondary | ICD-10-CM | POA: Diagnosis not present

## 2014-02-13 DIAGNOSIS — Z23 Encounter for immunization: Secondary | ICD-10-CM | POA: Diagnosis not present

## 2014-08-06 DIAGNOSIS — Z0000 Encounter for general adult medical examination without abnormal findings: Secondary | ICD-10-CM | POA: Diagnosis not present

## 2014-08-06 DIAGNOSIS — Z1389 Encounter for screening for other disorder: Secondary | ICD-10-CM | POA: Diagnosis not present

## 2014-08-06 DIAGNOSIS — I10 Essential (primary) hypertension: Secondary | ICD-10-CM | POA: Diagnosis not present

## 2014-08-06 DIAGNOSIS — H6120 Impacted cerumen, unspecified ear: Secondary | ICD-10-CM | POA: Diagnosis not present

## 2014-09-06 DIAGNOSIS — I10 Essential (primary) hypertension: Secondary | ICD-10-CM | POA: Diagnosis not present

## 2014-09-18 DIAGNOSIS — Z8546 Personal history of malignant neoplasm of prostate: Secondary | ICD-10-CM | POA: Diagnosis not present

## 2014-09-25 DIAGNOSIS — Z8546 Personal history of malignant neoplasm of prostate: Secondary | ICD-10-CM | POA: Diagnosis not present

## 2014-10-24 DIAGNOSIS — H3531 Nonexudative age-related macular degeneration: Secondary | ICD-10-CM | POA: Diagnosis not present

## 2014-10-24 DIAGNOSIS — H40013 Open angle with borderline findings, low risk, bilateral: Secondary | ICD-10-CM | POA: Diagnosis not present

## 2014-10-24 DIAGNOSIS — H35369 Drusen (degenerative) of macula, unspecified eye: Secondary | ICD-10-CM | POA: Diagnosis not present

## 2014-10-24 DIAGNOSIS — H10413 Chronic giant papillary conjunctivitis, bilateral: Secondary | ICD-10-CM | POA: Diagnosis not present

## 2014-10-24 DIAGNOSIS — H25813 Combined forms of age-related cataract, bilateral: Secondary | ICD-10-CM | POA: Diagnosis not present

## 2014-10-24 DIAGNOSIS — H04123 Dry eye syndrome of bilateral lacrimal glands: Secondary | ICD-10-CM | POA: Diagnosis not present

## 2014-11-05 DIAGNOSIS — H2512 Age-related nuclear cataract, left eye: Secondary | ICD-10-CM | POA: Diagnosis not present

## 2014-11-05 DIAGNOSIS — H25812 Combined forms of age-related cataract, left eye: Secondary | ICD-10-CM | POA: Diagnosis not present

## 2014-12-05 DIAGNOSIS — H3531 Nonexudative age-related macular degeneration: Secondary | ICD-10-CM | POA: Diagnosis not present

## 2014-12-24 DIAGNOSIS — Z23 Encounter for immunization: Secondary | ICD-10-CM | POA: Diagnosis not present

## 2015-02-05 DIAGNOSIS — I10 Essential (primary) hypertension: Secondary | ICD-10-CM | POA: Diagnosis not present

## 2015-12-29 ENCOUNTER — Emergency Department (HOSPITAL_COMMUNITY): Payer: Medicare Other

## 2015-12-29 ENCOUNTER — Encounter (HOSPITAL_COMMUNITY): Payer: Self-pay | Admitting: Emergency Medicine

## 2015-12-29 ENCOUNTER — Observation Stay (HOSPITAL_COMMUNITY)
Admission: EM | Admit: 2015-12-29 | Discharge: 2015-12-31 | Disposition: A | Discharge status: Home / Self Care | Payer: Medicare Other | Attending: Internal Medicine | Admitting: Internal Medicine

## 2015-12-29 ENCOUNTER — Ambulatory Visit (INDEPENDENT_AMBULATORY_CARE_PROVIDER_SITE_OTHER)
Admission: EM | Admit: 2015-12-29 | Discharge: 2015-12-29 | Disposition: A | Discharge status: Nursing Home (Includes ICF) | Payer: Medicare Other | Source: Home / Self Care | Attending: Internal Medicine | Admitting: Internal Medicine

## 2015-12-29 ENCOUNTER — Encounter (HOSPITAL_COMMUNITY): Payer: Self-pay | Admitting: *Deleted

## 2015-12-29 DIAGNOSIS — M7989 Other specified soft tissue disorders: Secondary | ICD-10-CM | POA: Diagnosis present

## 2015-12-29 DIAGNOSIS — R410 Disorientation, unspecified: Secondary | ICD-10-CM

## 2015-12-29 DIAGNOSIS — R791 Abnormal coagulation profile: Secondary | ICD-10-CM | POA: Diagnosis not present

## 2015-12-29 DIAGNOSIS — R4182 Altered mental status, unspecified: Principal | ICD-10-CM | POA: Diagnosis present

## 2015-12-29 DIAGNOSIS — I10 Essential (primary) hypertension: Secondary | ICD-10-CM | POA: Diagnosis present

## 2015-12-29 DIAGNOSIS — A419 Sepsis, unspecified organism: Secondary | ICD-10-CM

## 2015-12-29 DIAGNOSIS — Z8673 Personal history of transient ischemic attack (TIA), and cerebral infarction without residual deficits: Secondary | ICD-10-CM

## 2015-12-29 DIAGNOSIS — G9340 Encephalopathy, unspecified: Secondary | ICD-10-CM | POA: Diagnosis present

## 2015-12-29 DIAGNOSIS — N179 Acute kidney failure, unspecified: Secondary | ICD-10-CM

## 2015-12-29 DIAGNOSIS — Z8546 Personal history of malignant neoplasm of prostate: Secondary | ICD-10-CM | POA: Diagnosis not present

## 2015-12-29 DIAGNOSIS — R509 Fever, unspecified: Secondary | ICD-10-CM | POA: Diagnosis present

## 2015-12-29 DIAGNOSIS — D649 Anemia, unspecified: Secondary | ICD-10-CM | POA: Diagnosis present

## 2015-12-29 DIAGNOSIS — I4892 Unspecified atrial flutter: Secondary | ICD-10-CM | POA: Diagnosis present

## 2015-12-29 HISTORY — DX: Unspecified glaucoma: H40.9

## 2015-12-29 HISTORY — DX: Cerebral infarction, unspecified: I63.9

## 2015-12-29 LAB — CBC WITH DIFFERENTIAL/PLATELET
BASOS ABS: 0.1 10*3/uL (ref 0.0–0.1)
Basophils Relative: 1 %
EOS PCT: 1 %
Eosinophils Absolute: 0.1 10*3/uL (ref 0.0–0.7)
HEMATOCRIT: 38.5 % — AB (ref 39.0–52.0)
Hemoglobin: 12.6 g/dL — ABNORMAL LOW (ref 13.0–17.0)
LYMPHS PCT: 14 %
Lymphs Abs: 0.8 10*3/uL (ref 0.7–4.0)
MCH: 31 pg (ref 26.0–34.0)
MCHC: 32.7 g/dL (ref 30.0–36.0)
MCV: 94.6 fL (ref 78.0–100.0)
MONO ABS: 0.3 10*3/uL (ref 0.1–1.0)
MONOS PCT: 5 %
Neutro Abs: 4.6 10*3/uL (ref 1.7–7.7)
Neutrophils Relative %: 79 %
PLATELETS: 157 10*3/uL (ref 150–400)
RBC: 4.07 MIL/uL — ABNORMAL LOW (ref 4.22–5.81)
RDW: 13.2 % (ref 11.5–15.5)
WBC: 5.9 10*3/uL (ref 4.0–10.5)

## 2015-12-29 LAB — COMPREHENSIVE METABOLIC PANEL
ALT: 23 U/L (ref 17–63)
ANION GAP: 8 (ref 5–15)
AST: 50 U/L — AB (ref 15–41)
Albumin: 3.9 g/dL (ref 3.5–5.0)
Alkaline Phosphatase: 46 U/L (ref 38–126)
BILIRUBIN TOTAL: 1.5 mg/dL — AB (ref 0.3–1.2)
BUN: 22 mg/dL — ABNORMAL HIGH (ref 6–20)
CHLORIDE: 103 mmol/L (ref 101–111)
CO2: 24 mmol/L (ref 22–32)
Calcium: 9.3 mg/dL (ref 8.9–10.3)
Creatinine, Ser: 1.44 mg/dL — ABNORMAL HIGH (ref 0.61–1.24)
GFR, EST AFRICAN AMERICAN: 48 mL/min — AB (ref >60)
GFR, EST NON AFRICAN AMERICAN: 41 mL/min — AB (ref >60)
Glucose, Bld: 109 mg/dL — ABNORMAL HIGH (ref 65–99)
POTASSIUM: 3.7 mmol/L (ref 3.5–5.1)
Sodium: 135 mmol/L (ref 135–145)
TOTAL PROTEIN: 7.3 g/dL (ref 6.5–8.1)

## 2015-12-29 MED ORDER — PIPERACILLIN-TAZOBACTAM 3.375 G IVPB 30 MIN
3.3750 g | Freq: Once | INTRAVENOUS | Status: AC
  Administered 2015-12-29: 3.375 g via INTRAVENOUS
  Filled 2015-12-29: qty 50

## 2015-12-29 MED ORDER — SODIUM CHLORIDE 0.9 % IV BOLUS (SEPSIS)
500.0000 mL | Freq: Once | INTRAVENOUS | Status: AC
  Administered 2015-12-29: 500 mL via INTRAVENOUS

## 2015-12-29 MED ORDER — VANCOMYCIN HCL IN DEXTROSE 1-5 GM/200ML-% IV SOLN
1000.0000 mg | Freq: Once | INTRAVENOUS | Status: AC
  Administered 2015-12-30: 1000 mg via INTRAVENOUS
  Filled 2015-12-29: qty 200

## 2015-12-29 NOTE — ED Triage Notes (Signed)
Pt. transferred from [facility] , girlfriend reported she notice confusion/ disorientation yesterday with generalized weakness / fatigue , pt. alert and oriented at arrival , speech clear / no facial asymmetry , equal strong grips with no arm drift .

## 2015-12-29 NOTE — ED Provider Notes (Signed)
[place] DEPT Provider Note   CSN: [id number] Arrival date & time: 12/29/15  2006     History   Chief Complaint Chief Complaint  Patient presents with  . Altered Mental Status  5 caveat secondary to confusion HPI Ikaika Bhatia is a 82 y.o. male.  HPI *Hilyard is a 82 year old male who lives independently h.o. Stroke on anticoagulation secondary to intermittent atrial flutter. Marland Kitchen History is obtained from his friend who has power of attorney. She states that she speaks to him on a daily basis. He was well until today. He is supposed to come over to her house for lunch. When she called his house he was not answering. A neighbor noted that he was in his car and turned started for an extended period of time when they got there he was turned start the car with his housekeeping. They brought him into the emergency department secondary to confusion. He is weaker than baseline. He was unable to get out of the wheelchair and into the bed. He has cough observed. He states that he has had some coughing. He complains of generalized weakness. Past Medical History:  Diagnosis Date  . Glaucoma   . Stroke [facility])     Patient Active Problem List   Diagnosis Date Noted  . ADENOCARCINOMA, PROSTATE 06/28/2009  . SBO 06/28/2009    History reviewed. No pertinent surgical history.     Home Medications    Prior to Admission medications   Medication Sig Start Date End Date Taking? Authorizing Provider  UNKNOWN TO PATIENT On 2 meds + glaucoma eye gtts    Historical Provider, MD    Family History No family history on file.  Social History Social History  Substance Use Topics  . Smoking status: Never Smoker  . Smokeless tobacco: Not on file  . Alcohol use Not on file     Allergies   Aspirin   Review of Systems Review of Systems  All other systems reviewed and are negative.    Physical Exam Updated Vital Signs BP 143/70 (BP Location: Left Arm)   Pulse 77   Temp 98.9 F  (37.2 C) (Oral)   Resp 20   SpO2 99%   Physical Exam  Constitutional: He appears well-developed and well-nourished. No distress.  Skin is warm to touch  HENT:  Head: Normocephalic and atraumatic.  Right Ear: External ear normal.  Left Ear: External ear normal.  Mouth/Throat: Oropharynx is clear and moist.  Eyes: EOM are normal. Pupils are equal, round, and reactive to light.  Neck: Normal range of motion. Neck supple.  Cardiovascular: Normal rate, regular rhythm, normal heart sounds and intact distal pulses.   Pulmonary/Chest: Effort normal and breath sounds normal.  Abdominal: Soft. Bowel sounds are normal.  Musculoskeletal: Normal range of motion.  Neurological: He is alert. He displays normal reflexes. No cranial nerve deficit. He exhibits normal muscle tone. Coordination normal.  Patient is oriented to person place and time No focal deficits noted but patient is weak and unable to stand alone  Skin: Skin is warm. Capillary refill takes less than 2 seconds.  Nursing note and vitals reviewed.    ED Treatments / Results  Labs (all labs ordered are listed, but only abnormal results are displayed) Labs Reviewed  CBC WITH DIFFERENTIAL/PLATELET - Abnormal; Notable for the following:       Result Value   RBC 4.07 (*)    Hemoglobin 12.6 (*)    HCT 38.5 (*)    All other components  within normal limits  COMPREHENSIVE METABOLIC PANEL - Abnormal; Notable for the following:    Glucose, Bld 109 (*)    BUN 22 (*)    Creatinine, Ser 1.44 (*)    AST 50 (*)    Total Bilirubin 1.5 (*)    GFR calc non Af Amer 41 (*)    GFR calc Af Amer 48 (*)    All other components within normal limits  URINALYSIS, ROUTINE W REFLEX MICROSCOPIC (NOT AT [facility])  COMPREHENSIVE METABOLIC PANEL    EKG  EKG Interpretation  Date/Time:  Sunday December 29 2015 22:39:11 EDT Ventricular Rate:  64 PR Interval:  162 QRS Duration: 76 QT Interval:  426 QTC Calculation: 439 R Axis:   -20 Text  Interpretation:  Normal sinus rhythm Minimal voltage criteria for LVH, may be normal variant Nonspecific ST and T wave abnormality Abnormal ECG Confirmed by Ricki Clack MD, Andee Poles [phone]) on 12/29/2015 10:49:30 PM       Radiology Dg Chest 2 View  Result Date: 12/29/2015 CLINICAL DATA:  Altered mental state. EXAM: CHEST  2 VIEW COMPARISON:  July 29, 2009 FINDINGS: The heart size and mediastinal contours are within normal limits. Both lungs are clear. The visualized skeletal structures are unremarkable. IMPRESSION: No active cardiopulmonary disease. Electronically Signed   By: Dorise Bullion III M.D   On: 12/29/2015 22:12   Ct Head Wo Contrast  Result Date: 12/29/2015 CLINICAL DATA:  Confusion EXAM: CT HEAD WITHOUT CONTRAST TECHNIQUE: Contiguous axial images were obtained from the base of the skull through the vertex without intravenous contrast. COMPARISON:  None available FINDINGS: Brain: No evidence of acute infarction, hemorrhage, hydrocephalus, extra-axial collection or mass lesion/mass effect. Generalized atrophy. Chronic microvascular disease with confluent ischemic gliosis in the cerebral hemispheres. Vascular: No hyper dense vessel.  Atherosclerotic calcification. Skull: Negative Sinuses/Orbits: No acute finding. Mild mucosal thickening in the paranasal sinuses. Left cataract resection. 5 mm nodule in the intraconal left orbital fat. IMPRESSION: 1. No acute finding. 2. Generalized atrophy and chronic microvascular disease. 3. 5 mm intraconal left orbital mass. Statistically this would reflect an incidental cavernous hemangioma, but metastasis or other mass cannot be excluded. If no outside imaging for comparison, could obtain follow-up if clinically appropriate. Electronically Signed   By: Monte Fantasia M.D.   On: 12/29/2015 22:42    Procedures Procedures (including critical care time)  Medications Ordered in ED Medications  sodium chloride 0.9 % bolus 500 mL (not administered)      Initial Impression / Assessment and Plan / ED Course  I have reviewed the triage vital signs and the nursing notes.  Pertinent labs & imaging results that were available during my care of the patient were reviewed by me and considered in my medical decision making (see chart for details).  Clinical Course    64 male with fever and weakness.  Source ?urine or lungs- no infiltrate on cxr but patien tcoughing here.  Antibiotics given-zosyn /vanc.  AKI with last creatinine 0.96 and now 1.44.  Mass noted on ct head- doubt causing today's problems but will need follow up imaging. Lactic normal.  Urine pending.   Plan admission for further treatment .   Final Clinical Impressions(s) / ED Diagnoses   Final diagnoses:  Altered mental state    New Prescriptions New Prescriptions   No medications on file     Pattricia Boss, MD 12/30/15 0011

## 2015-12-29 NOTE — ED Provider Notes (Signed)
CSN: [id number]     Arrival date & time 12/29/15  1918 History   First MD Initiated Contact with Patient 12/29/15 1935     Chief Complaint  Patient presents with  . Altered Mental Status   (Consider location/radiation/quality/duration/timing/severity/associated sxs/prior Treatment) HPI History is obtained from Mario Mendez. She states that Mario Mendez is a 82 year old man that was found in Mario driveway attempting to start a car with Mario housekeeper. He then had trouble negotiating the seatbelt strap. When questioned he seemed very confused. And so they brought him to the urgent care for evaluation. There is been no headache or trauma no new medications that he may have ingested no history of diabetes. Past Medical History:  Diagnosis Date  . Glaucoma   . Stroke [facility])    No past surgical history on file. No family history on file. Social History  Substance Use Topics  . Smoking status: Never Smoker  . Smokeless tobacco: Not on file  . Alcohol use Not on file    Review of Systems Unable to obtain from patient due to confusion. Allergies  Aspirin  Home Medications   Prior to Admission medications   Medication Sig Start Date End Date Taking? Authorizing Provider  UNKNOWN TO PATIENT On 2 meds + glaucoma eye gtts   Yes Historical Provider, MD   Meds Ordered and Administered this Visit  Medications - No data to display  BP 125/71   Pulse 78   Temp 98.7 F (37.1 C) (Oral)   Resp 20   SpO2 95%  No data found.   Physical Exam NURSES NOTES AND VITAL SIGNS REVIEWED. CONSTITUTIONAL: Well developed, well nourished, no acute distress patient does not know the president, day of the week. He is able to recognize Mario friend is in the room although he cannot recall her name and Mario birthdate. He does note that he is at a [employer] facility.  HEENT: normocephalic, atraumatic EYES: Conjunctiva normal NECK:normal ROM, supple, no adenopathy PULMONARY:No respiratory distress,  normal effort ABDOMINAL: Soft, ND, NT BS+, No CVAT MUSCULOSKELETAL: Normal ROM of all extremities, Mario left foot is swollen with 2+ pitting edema. Gait is shuffling but he is able to walk and weight-bear. SKIN: warm and dry without rash PSYCHIATRIC: Mood and affect, behavior are normal   Urgent Care Course   Clinical Course    Procedures (including critical care time)  Labs Review Labs Reviewed - No data to display  Imaging Review No results found.   Visual Acuity Review  Right Eye Distance:   Left Eye Distance:   Bilateral Distance:    Right Eye Near:   Left Eye Near:    Bilateral Near:         MDM   1. Confusion    Patient in Mario Mendez verbalized that he would best be served in the emergency department for complete workup of Mario new onset of confusion. They are advised that Mario care is above the level of care provided in the urgent care.    Konrad Felix, [place] 12/29/15 2055

## 2015-12-29 NOTE — ED Triage Notes (Signed)
Spoke with UC about pt, pt increased confusion with unknown LSN. Pt normally ambulatory and lives alone, had trouble starting his car, tried to insert the key into the seatbelt. Pt spoke with family member yesterday but unknown when symptoms started.

## 2015-12-29 NOTE — ED Notes (Signed)
Unable to confirm medical hx, meds, past surgeries.

## 2015-12-30 ENCOUNTER — Observation Stay (HOSPITAL_COMMUNITY): Payer: Medicare Other

## 2015-12-30 ENCOUNTER — Ambulatory Visit (HOSPITAL_COMMUNITY): Payer: Medicare Other

## 2015-12-30 ENCOUNTER — Encounter (HOSPITAL_COMMUNITY): Payer: Self-pay | Admitting: Family Medicine

## 2015-12-30 DIAGNOSIS — G9340 Encephalopathy, unspecified: Secondary | ICD-10-CM | POA: Diagnosis not present

## 2015-12-30 DIAGNOSIS — I4892 Unspecified atrial flutter: Secondary | ICD-10-CM | POA: Diagnosis present

## 2015-12-30 DIAGNOSIS — R509 Fever, unspecified: Secondary | ICD-10-CM | POA: Diagnosis not present

## 2015-12-30 DIAGNOSIS — I10 Essential (primary) hypertension: Secondary | ICD-10-CM

## 2015-12-30 DIAGNOSIS — Z8673 Personal history of transient ischemic attack (TIA), and cerebral infarction without residual deficits: Secondary | ICD-10-CM | POA: Diagnosis not present

## 2015-12-30 DIAGNOSIS — N289 Disorder of kidney and ureter, unspecified: Secondary | ICD-10-CM

## 2015-12-30 DIAGNOSIS — D649 Anemia, unspecified: Secondary | ICD-10-CM

## 2015-12-30 DIAGNOSIS — R4182 Altered mental status, unspecified: Secondary | ICD-10-CM | POA: Diagnosis present

## 2015-12-30 DIAGNOSIS — M7989 Other specified soft tissue disorders: Secondary | ICD-10-CM | POA: Diagnosis present

## 2015-12-30 DIAGNOSIS — R410 Disorientation, unspecified: Secondary | ICD-10-CM | POA: Diagnosis present

## 2015-12-30 DIAGNOSIS — N179 Acute kidney failure, unspecified: Secondary | ICD-10-CM

## 2015-12-30 LAB — COMPREHENSIVE METABOLIC PANEL
ALT: 21 U/L (ref 17–63)
AST: 46 U/L — ABNORMAL HIGH (ref 15–41)
Albumin: 3.7 g/dL (ref 3.5–5.0)
Alkaline Phosphatase: 42 U/L (ref 38–126)
Anion gap: 6 (ref 5–15)
BILIRUBIN TOTAL: 1.8 mg/dL — AB (ref 0.3–1.2)
BUN: 14 mg/dL (ref 6–20)
CHLORIDE: 106 mmol/L (ref 101–111)
CO2: 29 mmol/L (ref 22–32)
CREATININE: 1.16 mg/dL (ref 0.61–1.24)
Calcium: 9.1 mg/dL (ref 8.9–10.3)
GFR, EST NON AFRICAN AMERICAN: 54 mL/min — AB (ref >60)
Glucose, Bld: 116 mg/dL — ABNORMAL HIGH (ref 65–99)
Potassium: 3.5 mmol/L (ref 3.5–5.1)
Sodium: 141 mmol/L (ref 135–145)
Total Protein: 6.9 g/dL (ref 6.5–8.1)

## 2015-12-30 LAB — CBC
HEMATOCRIT: 39.5 % (ref 39.0–52.0)
Hemoglobin: 13 g/dL (ref 13.0–17.0)
MCH: 30.8 pg (ref 26.0–34.0)
MCHC: 32.9 g/dL (ref 30.0–36.0)
MCV: 93.6 fL (ref 78.0–100.0)
PLATELETS: 140 10*3/uL — AB (ref 150–400)
RBC: 4.22 MIL/uL (ref 4.22–5.81)
RDW: 13.2 % (ref 11.5–15.5)
WBC: 4.3 10*3/uL (ref 4.0–10.5)

## 2015-12-30 LAB — IRON AND TIBC
Iron: 22 ug/dL — ABNORMAL LOW (ref 45–182)
SATURATION RATIOS: 8 % — AB (ref 17.9–39.5)
TIBC: 286 ug/dL (ref 250–450)
UIBC: 264 ug/dL

## 2015-12-30 LAB — LACTIC ACID, PLASMA
Lactic Acid, Venous: 1 mmol/L (ref 0.5–1.9)
Lactic Acid, Venous: 0.9 mmol/L (ref 0.5–1.9)

## 2015-12-30 LAB — TSH: TSH: 1.041 u[IU]/mL (ref 0.350–4.500)

## 2015-12-30 LAB — URINALYSIS, ROUTINE W REFLEX MICROSCOPIC
Bilirubin Urine: NEGATIVE
GLUCOSE, UA: NEGATIVE mg/dL
HGB URINE DIPSTICK: NEGATIVE
Ketones, ur: NEGATIVE mg/dL
Leukocytes, UA: NEGATIVE
Nitrite: NEGATIVE
PH: 5.5 (ref 5.0–8.0)
Protein, ur: NEGATIVE mg/dL
SPECIFIC GRAVITY, URINE: 1.022 (ref 1.005–1.030)

## 2015-12-30 LAB — FERRITIN: Ferritin: 107 ng/mL (ref 24–336)

## 2015-12-30 LAB — VITAMIN B12: Vitamin B-12: 909 pg/mL (ref 180–914)

## 2015-12-30 LAB — GLUCOSE, CAPILLARY: Glucose-Capillary: 137 mg/dL — ABNORMAL HIGH (ref 65–99)

## 2015-12-30 LAB — RPR: RPR: NONREACTIVE

## 2015-12-30 LAB — TROPONIN I

## 2015-12-30 LAB — PROTIME-INR
INR: 1.15
PROTHROMBIN TIME: 14.8 s (ref 11.4–15.2)

## 2015-12-30 LAB — PROCALCITONIN

## 2015-12-30 LAB — HIV ANTIBODY (ROUTINE TESTING W REFLEX): HIV SCREEN 4TH GENERATION: NONREACTIVE

## 2015-12-30 LAB — AMMONIA: Ammonia: 15 umol/L (ref 9–35)

## 2015-12-30 LAB — APTT: APTT: 30 s (ref 24–36)

## 2015-12-30 LAB — CG4 I-STAT (LACTIC ACID): LACTIC ACID, VENOUS: 1.13 mmol/L (ref 0.5–1.9)

## 2015-12-30 MED ORDER — ACETAMINOPHEN 650 MG RE SUPP: 650.0000 mg | Freq: Four times a day (QID) | RECTAL | Status: DC | PRN

## 2015-12-30 MED ORDER — HYDROCODONE-ACETAMINOPHEN 5-325 MG PO TABS: 1.0000 | ORAL_TABLET | ORAL | Status: DC | PRN

## 2015-12-30 MED ORDER — SODIUM CHLORIDE 0.9 % IV SOLN: INTRAVENOUS | Status: AC

## 2015-12-30 MED ORDER — SODIUM CHLORIDE 0.9 % IV SOLN: INTRAVENOUS | Status: DC

## 2015-12-30 MED ORDER — CLOPIDOGREL BISULFATE 75 MG PO TABS
75.0000 mg | ORAL_TABLET | Freq: Every day | ORAL | Status: DC
  Administered 2015-12-30 – 2015-12-31 (×2): 75 mg via ORAL
  Filled 2015-12-30 (×2): qty 1

## 2015-12-30 MED ORDER — ENOXAPARIN SODIUM 40 MG/0.4ML ~~LOC~~ SOLN
40.0000 mg | SUBCUTANEOUS | Status: DC
  Administered 2015-12-31: 40 mg via SUBCUTANEOUS
  Filled 2015-12-30: qty 0.4

## 2015-12-30 MED ORDER — PIPERACILLIN-TAZOBACTAM 3.375 G IVPB
3.3750 g | Freq: Three times a day (TID) | INTRAVENOUS | Status: DC
  Filled 2015-12-30 (×2): qty 50

## 2015-12-30 MED ORDER — SODIUM CHLORIDE 0.9% FLUSH: 3.0000 mL | Freq: Two times a day (BID) | INTRAVENOUS | Status: DC

## 2015-12-30 MED ORDER — GADOBENATE DIMEGLUMINE 529 MG/ML IV SOLN
15.0000 mL | Freq: Once | INTRAVENOUS | Status: AC | PRN
  Administered 2015-12-30: 15 mL via INTRAVENOUS

## 2015-12-30 MED ORDER — AMLODIPINE BESYLATE 5 MG PO TABS
5.0000 mg | ORAL_TABLET | Freq: Every day | ORAL | Status: DC
  Administered 2015-12-30 – 2015-12-31 (×2): 5 mg via ORAL
  Filled 2015-12-30 (×2): qty 1

## 2015-12-30 MED ORDER — HEPARIN SODIUM (PORCINE) 5000 UNIT/ML IJ SOLN: 5000.0000 [IU] | Freq: Three times a day (TID) | INTRAMUSCULAR | Status: DC

## 2015-12-30 MED ORDER — ACETAMINOPHEN 325 MG PO TABS: 650.0000 mg | ORAL_TABLET | Freq: Four times a day (QID) | ORAL | Status: DC | PRN

## 2015-12-30 MED ORDER — VANCOMYCIN HCL IN DEXTROSE 1-5 GM/200ML-% IV SOLN
1000.0000 mg | INTRAVENOUS | Status: DC
  Filled 2015-12-30: qty 200

## 2015-12-30 NOTE — H&P (Signed)
History and Physical    Mario Mendez [id number] DOB: 1924/03/02 DOA: 12/29/2015  PCP: Pcp Not In System   Patient coming from: Home   Chief Complaint: Confusion   HPI: Mario Mendez is a 82 y.o. male with medical history significant for paroxysmal atrial flutter, lacunar CVA in 2002, and chronic normocytic anemia who presents to the emergency department with confusion. Patient lives alone and has a friend who is his power of attorney that he speaks with daily. He was to have lunch with his friend this afternoon and she began calling him when he did not show up, but there was no answer. She had spoken to him yesterday and everything seemed normal. A neighbor witnessed the patient struggling for a long time to start his car and when out to help. Patient was found to be confused and unable to get the correct key in the ignition. He was taken to an urgent care for evaluation of this but directed to the emergency department for further evaluation. There has been no recent trauma and no change in his medications. Patient has not had any complaints, though he notes he has been sneezing some today. He denies headache, neck stiffness, photo or phonophobia, change in vision or hearing, or focal numbness or weakness. He denies abdominal pain, nausea, vomiting, diarrhea, or constipation. Patient denies dysuria or flank pain. He denies any rash or wound. He denies use of alcohol or illicit substances. He recalls being confused earlier, but states that he feels better now.  ED Course: Upon arrival to the ED, patient is found to be febrile to 38 C, saturating adequately on room air, and with vital signs stable. EKG demonstrates normal sinus rhythm with nonspecific ST-T abnormality. Chest x-ray is negative for acute cardiopulmonary disease. CT of the head without contrast is negative for acute intracranial abnormality but there is incidental notation of a 5 mm intraconal left orbit mass which is likely a  cavernous hemangioma but can be followed up. Chemistry panel is notable for serum creatinine 1.44, up from less than 1 back in 2011. There is a mild elevation in total bilirubin of 1.5 which appears stable relative to prior labs. Urinalysis was obtained and unremarkable. Blood and urine cultures were obtained and a 500 cc normal saline bolus was given. There was attempt to ambulate the patient, however he was generally very weak and required max assistance just to get from the wheelchair to the hospital bed. He will be observed on the telemetry unit for ongoing evaluation and management of febrile illness with acute encephalopathy but no focal neurologic findings and 38 C.  Review of Systems:  All other systems reviewed and apart from HPI, are negative.  Past Medical History:  Diagnosis Date  . Glaucoma   . Stroke [facility])     History reviewed. No pertinent surgical history.   reports that he has never smoked. He has never used smokeless tobacco. He reports that he does not use drugs. His alcohol history is not on file.  Allergies  Allergen Reactions  . Aspirin Other (See Comments)    unknown    Family History  Problem Relation Age of Onset  . Hypertension Other      Prior to Admission medications   Medication Sig Start Date End Date Taking? Authorizing Provider  amLODipine (NORVASC) 5 MG tablet Take 5 mg by mouth daily.   Yes Historical Provider, MD  clopidogrel (PLAVIX) 75 MG tablet Take 75 mg by mouth daily.   Yes Historical  Provider, MD    Physical Exam: Vitals:   12/29/15 2012 12/29/15 2235  BP: 143/70 144/90  Pulse: 77   Resp: 20 18  Temp: 98.9 F (37.2 C) 100.4 F (38 C)  TempSrc: Oral Rectal  SpO2: 99% 98%    Constitutional: NAD, calm, comfortable Eyes: PERTLA, lids and conjunctivae normal ENMT: Mucous membranes are dry. Posterior pharynx clear of any exudate or lesions.   Neck: normal, supple, no masses, no thyromegaly Respiratory: clear to auscultation  bilaterally, no wheezing, no crackles. Normal respiratory effort.   Cardiovascular: S1 & S2 heard, regular rate and rhythm, no significant murmur. 2+ LLE edema. No carotid bruits. No significant JVD. Abdomen: No distension, no tenderness, no masses palpated. Bowel sounds normal.  Musculoskeletal: no clubbing / cyanosis. No joint deformity upper and lower extremities. Normal muscle tone.  Skin: no significant rashes, lesions, ulcers. Warm, dry, well-perfused. Neurologic: CN 2-12 grossly intact. Sensation intact, DTR normal. Strength 5/5 in all 4 limbs. PERRL, EOMI. Psychiatric: Normal judgment and insight. Alert and oriented to person and place but not month or year. Normal mood and affect.    Labs on Admission: I have personally reviewed following labs and imaging studies  CBC:  Recent Labs Lab 12/29/15 2017  WBC 5.9  NEUTROABS 4.6  HGB 12.6*  HCT 38.5*  MCV 94.6  PLT [id number]   Basic Metabolic Panel:  Recent Labs Lab 12/29/15 2017  NA 135  K 3.7  CL 103  CO2 24  GLUCOSE 109*  BUN 22*  CREATININE 1.44*  CALCIUM 9.3   GFR: CrCl cannot be calculated (Unknown ideal weight.). Liver Function Tests:  Recent Labs Lab 12/29/15 2017  AST 50*  ALT 23  ALKPHOS 46  BILITOT 1.5*  PROT 7.3  ALBUMIN 3.9   No results for input(s): LIPASE, AMYLASE in the last 168 hours. No results for input(s): AMMONIA in the last 168 hours. Coagulation Profile: No results for input(s): INR, PROTIME in the last 168 hours. Cardiac Enzymes: No results for input(s): CKTOTAL, CKMB, CKMBINDEX, TROPONINI in the last 168 hours. BNP (last 3 results) No results for input(s): PROBNP in the last 8760 hours. HbA1C: No results for input(s): HGBA1C in the last 72 hours. CBG: No results for input(s): GLUCAP in the last 168 hours. Lipid Profile: No results for input(s): CHOL, HDL, LDLCALC, TRIG, CHOLHDL, LDLDIRECT in the last 72 hours. Thyroid Function Tests: No results for input(s): TSH, T4TOTAL, FREET4,  T3FREE, THYROIDAB in the last 72 hours. Anemia Panel: No results for input(s): VITAMINB12, FOLATE, FERRITIN, TIBC, IRON, RETICCTPCT in the last 72 hours. Urine analysis:    Component Value Date/Time   COLORURINE YELLOW 12/30/2015 0001   APPEARANCEUR CLEAR 12/30/2015 0001   LABSPEC 1.022 12/30/2015 0001   PHURINE 5.5 12/30/2015 0001   GLUCOSEU NEGATIVE 12/30/2015 0001   HGBUR NEGATIVE 12/30/2015 0001   BILIRUBINUR NEGATIVE 12/30/2015 0001   KETONESUR NEGATIVE 12/30/2015 0001   PROTEINUR NEGATIVE 12/30/2015 0001   UROBILINOGEN 0.2 06/30/2009 2147   NITRITE NEGATIVE 12/30/2015 0001   LEUKOCYTESUR NEGATIVE 12/30/2015 0001   Sepsis Labs: @LABRCNTIP (procalcitonin:4,lacticidven:4) )No results found for this or any previous visit (from the past 240 hour(s)).   Radiological Exams on Admission: Dg Chest 2 View  Result Date: 12/29/2015 CLINICAL DATA:  Altered mental state. EXAM: CHEST  2 VIEW COMPARISON:  July 29, 2009 FINDINGS: The heart size and mediastinal contours are within normal limits. Both lungs are clear. The visualized skeletal structures are unremarkable. IMPRESSION: No active cardiopulmonary disease. Electronically Signed  By: Dorise Bullion III M.D   On: 12/29/2015 22:12   Ct Head Wo Contrast  Result Date: 12/29/2015 CLINICAL DATA:  Confusion EXAM: CT HEAD WITHOUT CONTRAST TECHNIQUE: Contiguous axial images were obtained from the base of the skull through the vertex without intravenous contrast. COMPARISON:  None available FINDINGS: Brain: No evidence of acute infarction, hemorrhage, hydrocephalus, extra-axial collection or mass lesion/mass effect. Generalized atrophy. Chronic microvascular disease with confluent ischemic gliosis in the cerebral hemispheres. Vascular: No hyper dense vessel.  Atherosclerotic calcification. Skull: Negative Sinuses/Orbits: No acute finding. Mild mucosal thickening in the paranasal sinuses. Left cataract resection. 5 mm nodule in the intraconal left  orbital fat. IMPRESSION: 1. No acute finding. 2. Generalized atrophy and chronic microvascular disease. 3. 5 mm intraconal left orbital mass. Statistically this would reflect an incidental cavernous hemangioma, but metastasis or other mass cannot be excluded. If no outside imaging for comparison, could obtain follow-up if clinically appropriate. Electronically Signed   By: Monte Fantasia M.D.   On: 12/29/2015 22:42    EKG: Independently reviewed. Normal sinus rhythm, non-specific ST-T abnormalities   Assessment/Plan  1. Acute encephalopathy  - Suspect a toxic-metabolic etiology  - Head CT notable only for incidental intraconal left orbital mass of 5 mm felt to be a cavernous hemangioma - Check TSH, ammonia, RPR, B12  - No meningeal signs    2. Fevers  - Febrile to 38 C on arrival with no leukocytosis or localizing features  - CXR without infiltrate, UA not suggestive of infection, no rash or wound identified, and abd exam benign  - Blood and urine cultures have been sent  - Empiric vancomycin and Zosyn were started in ED and will be continued for now  - If worsens, would consider abdominal imaging; if improves and cultures neg for 24 hrs, would consider d/c abx  - Trend pro-calcitonin, lactate, fever curve    3. Kidney disease of uncertain chronicity  - SCr 1.44 with no chem panels in EMR more recent than 2011 when SCr was 0.96  - Given a 500 cc NS bolus in ED and will be continued on NS at 100 cc/hr  - Repeat chem panel in am   4. Paroxysmal atrial flutter  - In sinus rhythm on presentation  - Chart note reports he is on anticoagulation, but only Plavix on med-list  - CHADS-VASc at least 5 (age x2, CVA x2, HTN)  - Monitoring on telemetry    5. Hx of CVA  - No focal deficits elicited - Head CT with 5 mm left orbital mass, but no acute CNS findings  - Continue Plavix    6. Hypertension  - At goal currently  - Continue Norvasc with monitoring    7. LLE edema  - Unilateral  LLE edema without tenderness or palpable cord  - Pt describes as chronic, denies hx of VTE  - Check venous doppler   8. Normocytic anemia  - Hgb 12.6 on admission and stable relative to prior CBC's  - No s/s of active blood-loss  - Check iron-studies, B12, folate and supplement as indicated    DVT prophylaxis: sq heparin  Code Status: Full  Family Communication: Discussed with patient  Disposition Plan: Observe on telemetry Consults called: None  Admission status: Observation    Vianne Bulls, MD [facility] Pager [phone]  If 7PM-7AM, please contact night-coverage [email or web address] Password [email or web address]  12/30/2015, 12:30 AM

## 2015-12-30 NOTE — Progress Notes (Signed)
Progress Note    Mario Mendez  [id number] DOB: 16-Apr-1924  DOA: 12/29/2015 PCP: Pcp Not In System    Brief Narrative:   Mario Mendez is an 82 y.o. male with medical history significant for paroxysmal atrial flutter, lacunar CVA in 2002, and chronic normocytic anemia who Was admitted 12/29/15 for evaluation of confusion. Patient was noted to be febrile with an increase in his serum creatinine and was observed to be weak and therefore was admitted for further evaluation.  Assessment/Plan:   Principal problems:  Acute encephalopathy  Head CT notable only for incidental intraconal left orbital mass of 5 mm felt to be a cavernous hemangioma. TSH, ammonia, B-12 WNL. RPR nonreactive. Feels well overall and exam is normal without localizing signs. We'll check an MRI scan given his history of prior stroke.  Fevers  Febrile to 38 C on arrival with no leukocytosis or localizing features. CXR without infiltrate, UA not suggestive of infection, no rash or wound identified, and abd exam benign. Blood and urine cultures have been sent. Empiric vancomycin and Zosyn were started in ED, but Pro calcitonin and lactic acid is not elevated. WBC not elevated. Discontinue antibiotics and monitor.    Active problems:  Acute kidney injury  SCr 1.44 with no chem panels in EMR more recent than 2011 when SCr was 0.96. Given a 500 cc NS bolus in ED and will be continued on NS at 100 cc/hr. Creatinine much better today and close to normal baseline suggestive of dehydration.  Paroxysmal atrial flutter  Maintaining normal sinus rhythm. CHADS-VASc at least 36 (age x2, CVA x2, HTN), but given age with possible fall risk, may not be a candidate for chronic anticoagulation.     Hx of CVA  No focal deficits elicited. Head CT with 5 mm left orbital mass, but no acute CNS findings. Continue Plavix.    Hypertension  Continue Norvasc.  LLE edema  Unilateral LLE edema without tenderness or palpable cord.  Pt describes as chronic, denies hx of VTE. F/U  venous doppler.   Normocytic anemia/anemia of chronic disease  Mild, iron low but ferritin within normal limits suggestive of anemia of chronic disease.  Family Communication/Anticipated D/C date and plan/Code Status   DVT prophylaxis: Lovenox ordered. Code Status: Full Code.  Family Communication: No family at the bedside. Lissa Morales updated by telephone. Disposition Plan: Home versus SNF depending on PT evaluation and recommendations. Lives alone and may need 24-hour supervision.   Medical Consultants:    None.   Procedures:    None  Anti-Infectives:   Vancomycin/Zosyn 12/29/15---> 12/30/15  Subjective:   Cecil Cranker tells me his appetite is good, denies any pain except for chronic left knee pain, denies dyspnea, cough, or other symptoms suggestive of infection. No dysuria.  Objective:    Vitals:   12/30/15 0600 12/30/15 0630 12/30/15 0700 12/30/15 0900  BP: 172/73 167/70 166/81 (!) 158/81  Pulse: (!) 55 (!) 52 (!) 52 (!) 58  Resp: 17 18 16 18   Temp:    98 F (36.7 C)  TempSrc:    Oral  SpO2: 96% 98% 96% 97%    Intake/Output Summary (Last 24 hours) at 12/30/15 1125 Last data filed at 12/30/15 0514  Gross per 24 hour  Intake                0 ml  Output             1050 ml  Net            -  1050 ml   There were no vitals filed for this visit.  Exam: General exam: Appears calm and comfortable.  Respiratory system: Clear to auscultation. Respiratory effort normal. Cardiovascular system: S1 & S2 heard, RRR. No JVD,  rubs, gallops or clicks. No murmurs. Gastrointestinal system: Abdomen is nondistended, soft and nontender. No organomegaly or masses felt. Normal bowel sounds heard. Central nervous system: Alert and oriented x 2. No focal neurological deficits. Extremities: No clubbing,  or cyanosis. LLE edema. Skin: No rashes, lesions or ulcers. Psychiatry: Judgement and insight appear normal.  Mood & affect appropriate.   Data Reviewed:   I have personally reviewed following labs and imaging studies:  Labs: Basic Metabolic Panel:  Recent Labs Lab 12/29/15 2017 12/30/15 0756  NA 135 141  K 3.7 3.5  CL 103 106  CO2 24 29  GLUCOSE 109* 116*  BUN 22* 14  CREATININE 1.44* 1.16  CALCIUM 9.3 9.1   GFR CrCl cannot be calculated (Unknown ideal weight.). Liver Function Tests:  Recent Labs Lab 12/29/15 2017 12/30/15 0756  AST 50* 46*  ALT 23 21  ALKPHOS 46 42  BILITOT 1.5* 1.8*  PROT 7.3 6.9  ALBUMIN 3.9 3.7    Recent Labs Lab 12/30/15 0246  AMMONIA 15   Coagulation profile  Recent Labs Lab 12/30/15 0244  INR 1.15    CBC:  Recent Labs Lab 12/29/15 2017 12/30/15 0756  WBC 5.9 4.3  NEUTROABS 4.6  --   HGB 12.6* 13.0  HCT 38.5* 39.5  MCV 94.6 93.6  PLT 157 140*   Cardiac Enzymes:  Recent Labs Lab 12/30/15 0244  TROPONINI <0.03   Thyroid function studies:  Recent Labs  12/30/15 0245  TSH 1.041   Anemia work up:  Recent Labs  12/30/15 0245  VITAMINB12 909  FERRITIN 107  TIBC 286  IRON 22*   Sepsis Labs:  Recent Labs Lab 12/29/15 2017 12/29/15 2312 12/30/15 0244 12/30/15 0248 12/30/15 0756  PROCALCITON  --   --  <0.10  --   --   WBC 5.9  --   --   --  4.3  LATICACIDVEN  --  1.13  --  1.0 0.9    Microbiology No results found for this or any previous visit (from the past 240 hour(s)).  Radiology: Dg Chest 2 View  Result Date: 12/29/2015 CLINICAL DATA:  Altered mental state. EXAM: CHEST  2 VIEW COMPARISON:  July 29, 2009 FINDINGS: The heart size and mediastinal contours are within normal limits. Both lungs are clear. The visualized skeletal structures are unremarkable. IMPRESSION: No active cardiopulmonary disease. Electronically Signed   By: Dorise Bullion III M.D   On: 12/29/2015 22:12   Ct Head Wo Contrast  Result Date: 12/29/2015 CLINICAL DATA:  Confusion EXAM: CT HEAD WITHOUT CONTRAST TECHNIQUE: Contiguous  axial images were obtained from the base of the skull through the vertex without intravenous contrast. COMPARISON:  None available FINDINGS: Brain: No evidence of acute infarction, hemorrhage, hydrocephalus, extra-axial collection or mass lesion/mass effect. Generalized atrophy. Chronic microvascular disease with confluent ischemic gliosis in the cerebral hemispheres. Vascular: No hyper dense vessel.  Atherosclerotic calcification. Skull: Negative Sinuses/Orbits: No acute finding. Mild mucosal thickening in the paranasal sinuses. Left cataract resection. 5 mm nodule in the intraconal left orbital fat. IMPRESSION: 1. No acute finding. 2. Generalized atrophy and chronic microvascular disease. 3. 5 mm intraconal left orbital mass. Statistically this would reflect an incidental cavernous hemangioma, but metastasis or other mass cannot be excluded. If no outside imaging  for comparison, could obtain follow-up if clinically appropriate. Electronically Signed   By: Monte Fantasia M.D.   On: 12/29/2015 22:42   US Abdomen Complete  Result Date: 12/30/2015 CLINICAL DATA:  Confusion, sepsis.  Increased creatinine. EXAM: ABDOMEN ULTRASOUND COMPLETE COMPARISON:  None. FINDINGS: Gallbladder: No gallstones or wall thickening visualized. No sonographic Murphy sign noted by sonographer. Common bile duct: Diameter: 3 mm Liver: No focal lesion identified. Within normal limits in parenchymal echogenicity. IVC: No abnormality visualized. Pancreas: Visualized portion unremarkable. The pancreatic tail was not seen due to overlying bowel gas. Spleen: Size and appearance within normal limits. Right Kidney: Length: 11.3 cm. Echogenicity is mildly increased. No solid mass or hydronephrosis visualized. There are 2 small renal cysts near the midportion of the kidney, the larger of which measures 1.5 x 1.4 x 1.3 cm. Left Kidney: Length: 10.8 cm. Echogenicity is mildly increased. No mass or hydronephrosis visualized. Abdominal aorta: No  aneurysm visualized. Other findings: None. IMPRESSION: 1. Mildly increased echogenicity of the kidneys, which may be seen in the setting of medical renal disease. 2. No hydronephrosis or nephrolithiasis. 3. Otherwise unremarkable abdominal ultrasound. Electronically Signed   By: Ulyses Jarred M.D.   On: 12/30/2015 01:24    Medications:   . amLODipine  5 mg Oral Daily  . clopidogrel  75 mg Oral Daily  . heparin  5,000 Units Subcutaneous Q8H  . piperacillin-tazobactam (ZOSYN)  IV  3.375 g Intravenous Q8H  . sodium chloride flush  3 mL Intravenous Q12H  . vancomycin  1,000 mg Intravenous Q24H   Continuous Infusions:   Time spent: 25 minutes.   LOS: 0 days   Kattaleya Alia  [facility] Pager [phone]. If unable to reach me by pager, please call my cell phone at [phone].  *Please refer to [email or web address], password [email or web address] to get updated schedule on who will round on this patient, as hospitalists switch teams weekly. If 7PM-7AM, please contact night-coverage at [email or web address], password [email or web address] for any overnight needs.  12/30/2015, 11:25 AM

## 2015-12-30 NOTE — Evaluation (Signed)
Physical Therapy Evaluation Patient Details Name: Mario Mendez MRN: [id number] DOB: [birth year] Today's Date: 12/30/2015   History of Present Illness  82 yo male with onset of confusion and difficulty remembering how to start his car was admitted on OBS, has acute encephalopathy and noted a small hemangioma on testing.  PMHx:  CKD, PAF, CVA 2002, LLE edema, HTN, glaucoma, prostate CA, SBO  Clinical Impression  Pt is newly admitted and demonstrating some minor safety issues that require some assistance at home hopefully temporarily.  He is able to be assisted to walk on walker and will progress this with nursing and acute therapy hopefully to decrease the device and hands on assistance.      Follow Up Recommendations Home health PT;Supervision/Assistance - 24 hour    Equipment Recommendations  Rolling walker with 5" wheels    Recommendations for Other Services Rehab consult     Precautions / Restrictions Precautions Precautions: Fall Restrictions Weight Bearing Restrictions: No      Mobility  Bed Mobility Overal bed mobility: Needs Assistance Bed Mobility: Supine to Sit;Sit to Supine     Supine to sit: Supervision (for safety) Sit to supine: Supervision (for safety)      Transfers Overall transfer level: Needs assistance Equipment used: Rolling walker (2 wheeled) Transfers: Sit to/from [facility] Sit to Stand: Supervision;Min guard Stand pivot transfers: Supervision;Min guard          Ambulation/Gait Ambulation/Gait assistance: Min guard;Min assist (for walker) Ambulation Distance (Feet): 100 Feet Assistive device: Rolling walker (2 wheeled) Gait Pattern/deviations: Step-through pattern;Wide base of support;Trunk flexed;Decreased stride length Gait velocity: reduced Gait velocity interpretation: Below normal speed for age/gender General Gait Details: Pt is up with PT assisting and has been notably confused about what to do and where to be, needs  RW for support and safety despite his recent independence  Stairs            Wheelchair Mobility    Modified Rankin (Stroke Patients Only) Modified Rankin (Stroke Patients Only) Pre-Morbid Rankin Score: No significant disability Modified Rankin: Moderate disability     Balance Overall balance assessment: Needs assistance Sitting-balance support: Feet supported Sitting balance-Leahy Scale: Good     Standing balance support: Bilateral upper extremity supported Standing balance-Leahy Scale: Fair                               Pertinent Vitals/Pain Pain Assessment: No/denies pain    Home Living Family/patient expects to be discharged to:: Private residence Living Arrangements: Alone Available Help at Discharge: Friend(s);Available PRN/intermittently (pt cannot recall ) Type of Home: House Home Access: Stairs to enter Entrance Stairs-Rails: Right;Left;Can reach both Entrance Stairs-Number of Steps: 2 Home Layout: One level Home Equipment: None (per confused pt who reports no AD needed, was going to [facility])      Prior Function Level of Independence: Independent (per pt)               Hand Dominance        Extremity/Trunk Assessment   Upper Extremity Assessment: Overall WFL for tasks assessed           Lower Extremity Assessment: Overall WFL for tasks assessed      Cervical / Trunk Assessment: Kyphotic  Communication   Communication: No difficulties  Cognition Arousal/Alertness: Awake/alert Behavior During Therapy: Flat affect Overall Cognitive Status: No family/caregiver present to determine baseline cognitive functioning       Memory: Decreased short-term  memory;Decreased recall of precautions              General Comments      Exercises        Assessment/Plan    PT Assessment Patient needs continued PT services  PT Diagnosis Abnormality of gait   PT Problem List Decreased range of motion;Decreased activity  tolerance;Decreased balance;Decreased mobility;Decreased coordination;Decreased cognition;Decreased knowledge of use of DME;Decreased safety awareness;Decreased knowledge of precautions;Cardiopulmonary status limiting activity  PT Treatment Interventions DME instruction;Gait training;Stair training;Functional mobility training;Therapeutic activities;Therapeutic exercise;Balance training;Neuromuscular re-education;Patient/family education   PT Goals (Current goals can be found in the Care Plan section) Acute Rehab PT Goals Patient Stated Goal: to try to walk more PT Goal Formulation: With patient Time For Goal Achievement: 01/13/16 Potential to Achieve Goals: Good    Frequency Min 3X/week   Barriers to discharge Decreased caregiver support home mainly alone    Co-evaluation               End of Session Equipment Utilized During Treatment: Gait belt Activity Tolerance: Patient tolerated treatment well;Patient limited by fatigue Patient left: in bed;with call bell/phone within reach;with bed alarm set Nurse Communication: Mobility status    Functional Assessment Tool Used: clinical judgment Functional Limitation: Mobility: Walking and moving around Mobility: Walking and Moving Around Current Status [id number]): At least 20 percent but less than 40 percent impaired, limited or restricted Mobility: Walking and Moving Around Goal Status [phone]): At least 1 percent but less than 20 percent impaired, limited or restricted    Time: 1203-1229 PT Time Calculation (min) (ACUTE ONLY): 26 min   Charges:   PT Evaluation $PT Eval Low Complexity: 1 Procedure PT Treatments $Gait Training: 8-22 mins   PT G Codes:   PT G-Codes **NOT FOR INPATIENT CLASS** Functional Assessment Tool Used: clinical judgment Functional Limitation: Mobility: Walking and moving around Mobility: Walking and Moving Around Current Status [id number]): At least 20 percent but less than 40 percent impaired, limited or  restricted Mobility: Walking and Moving Around Goal Status [phone]): At least 1 percent but less than 20 percent impaired, limited or restricted    Ramond Dial 12/30/2015, 12:46 PM    Mee Hives, PT MS Acute Rehab Dept. Number: [place] and [place]

## 2015-12-30 NOTE — ED Notes (Signed)
Meal order was placed earlier in the day however he has not received it.  Called the [facility] and they agreed to take th meal to the room 6E room7.

## 2015-12-30 NOTE — Progress Notes (Signed)
Pharmacy Antibiotic Note  Mario Mendez is a 82 y.o. male admitted on 12/29/2015 with altered mental status/fever.  Pharmacy has been consulted for Vancomycin/Zosyn dosing. WBC WNL. Mild bump in Scr. Imaging unremarkable for infectious process.   Plan: -Vancomycin 1000 mg IV q24h -Zosyn 3.375G IV q8h to be infused over 4 hours -Trend WBC, temp, renal function  -Drug levels as indicated   Temp (24hrs), Avg:99.3 F (37.4 C), Min:98.7 F (37.1 C), Max:100.4 F (38 C)   Recent Labs Lab 12/29/15 2017  WBC 5.9  CREATININE 1.44*    CrCl cannot be calculated (Unknown ideal weight.).    Allergies  Allergen Reactions  . Aspirin Other (See Comments)    unknown    Narda Bonds 12/30/2015 1:49 AM

## 2015-12-31 ENCOUNTER — Ambulatory Visit (HOSPITAL_BASED_OUTPATIENT_CLINIC_OR_DEPARTMENT_OTHER): Payer: Medicare Other

## 2015-12-31 DIAGNOSIS — M7989 Other specified soft tissue disorders: Secondary | ICD-10-CM

## 2015-12-31 DIAGNOSIS — R410 Disorientation, unspecified: Secondary | ICD-10-CM | POA: Diagnosis not present

## 2015-12-31 LAB — BASIC METABOLIC PANEL
ANION GAP: 5 (ref 5–15)
BUN: 18 mg/dL (ref 6–20)
CALCIUM: 8.7 mg/dL — AB (ref 8.9–10.3)
CO2: 27 mmol/L (ref 22–32)
Chloride: 108 mmol/L (ref 101–111)
Creatinine, Ser: 1.08 mg/dL (ref 0.61–1.24)
GFR calc Af Amer: >60 mL/min (ref >60)
GFR, EST NON AFRICAN AMERICAN: 58 mL/min — AB (ref >60)
GLUCOSE: 111 mg/dL — AB (ref 65–99)
POTASSIUM: 4 mmol/L (ref 3.5–5.1)
Sodium: 140 mmol/L (ref 135–145)

## 2015-12-31 LAB — FOLATE RBC
FOLATE, HEMOLYSATE: 446.5 ng/mL
Folate, RBC: 1265 ng/mL (ref >498)
Hematocrit: 35.3 % — ABNORMAL LOW (ref 37.5–51.0)

## 2015-12-31 LAB — URINE CULTURE

## 2015-12-31 LAB — GLUCOSE, CAPILLARY: Glucose-Capillary: 99 mg/dL (ref 65–99)

## 2015-12-31 NOTE — Progress Notes (Signed)
Pt. Dc'd home. Left the unit via wc and Dc'd home via auto. IV removed. Pt. Received discharge packet. All questions answered. Cardell Peach, RN

## 2015-12-31 NOTE — Discharge Summary (Signed)
Discharge Summary  Mario Mendez [id number] DOB: 12-Oct-1923  PCP: Pcp Not In System  Admit date: 12/29/2015 Discharge date: 12/31/2015   Recommendations for Outpatient Follow-up:  1. PCP 1 week 2. Home health PT and 24 hour supervision.   Discharge Diagnoses:  Active Hospital Problems   Diagnosis Date Noted  . Confusion 12/30/2015  . Febrile illness 12/30/2015  . Acute kidney injury ([place]) 12/30/2015  . Normocytic anemia 12/30/2015  . Paroxysmal atrial flutter ([place]) 12/30/2015  . History of CVA (cerebrovascular accident) 12/30/2015  . Left leg swelling 12/30/2015  . Hypertension 12/30/2015  . Acute encephalopathy 12/30/2015  . Altered mental state     Resolved Hospital Problems   Diagnosis Date Noted Date Resolved  No resolved problems to display.   Discharge Condition: Stable   Diet recommendation: Regular    Vitals:   12/31/15 0528 12/31/15 1023  BP: (!) 158/71 137/67  Pulse: 64   Resp: 18   Temp: 98.6 F (37 C)     History of present illness:  Mario Mendez is an 82 y.o. male with medical history significant forparoxysmal atrial flutter, lacunar CVA in 2002, and chronic normocytic anemia who Was admitted 12/29/15 for evaluation of confusion. Patient was noted to be febrile with an increase in his serum creatinine and was observed to be weak and therefore was admitted for further evaluation.   Hospital Course:  Principal Problem:   Confusion Active Problems:   Febrile illness   Acute kidney injury ([place])   Normocytic anemia   Paroxysmal atrial flutter ([facility])   History of CVA (cerebrovascular accident)   Left leg swelling   Hypertension   Acute encephalopathy   Altered mental state  Patient was admitted for observation given AKI and fever. Was treated with empiric antibiotics but discontinued on 8/21 due to lack of localizing symptoms or history. He has done well. Was hydrated and his renal function has also improved. He was seen by PT who recommend  home with HHPT and 24 hour supervision. Patient lives at home and plans to go home with his friend Mario Mendez who is his POA, but I was unable to reach her by phone on two occasions today. Patient will be discharged with HHPT once we are able to contact her and verify arrangements.   Procedures:  None   Consultations:  None   Discharge Exam: BP 137/67   Pulse 64   Temp 98.6 F (37 C) (Oral)   Resp 18   Ht 6' (1.829 m)   Wt 84.5 kg (186 lb 4.6 oz)   SpO2 95%   BMI 25.27 kg/m  General:  Alert, oriented, calm, in no acute distress  Eyes: pupils round and reactive to light and accomodation, clear sclerea Neck: supple, no masses, trachea mildline  Cardiovascular: RRR, no murmurs or rubs, no peripheral edema  Respiratory: clear to auscultation bilaterally, no wheezes, no crackles  Abdomen: soft, nontender, nondistended, normal bowel tones heard  Skin: dry, no rashes  Musculoskeletal: no joint effusions, normal range of motion  Psychiatric: appropriate affect, normal speech  Neurologic: extraocular muscles intact, clear speech, moving all extremities with intact sensorium    Discharge Instructions You were cared for by a hospitalist during your hospital stay. If you have any questions about your discharge medications or the care you received while you were in the hospital after you are discharged, you can call the unit and asked to speak with the hospitalist on call if the hospitalist that took care of you is  not available. Once you are discharged, your primary care physician will handle any further medical issues. Please note that NO REFILLS for any discharge medications will be authorized once you are discharged, as it is imperative that you return to your primary care physician (or establish a relationship with a primary care physician if you do not have one) for your aftercare needs so that they can reassess your need for medications and monitor your lab values.  Discharge  Instructions    Diet - low sodium heart healthy    Complete by:  As directed   Increase activity slowly    Complete by:  As directed       Medication List    TAKE these medications   amLODipine 5 MG tablet Commonly known as:  NORVASC Take 5 mg by mouth daily.   clopidogrel 75 MG tablet Commonly known as:  PLAVIX Take 75 mg by mouth daily.   COMBIGAN 0.2-0.5 % ophthalmic solution Generic drug:  brimonidine-timolol Place 1 drop into both eyes every 12 (twelve) hours.   latanoprost 0.005 % ophthalmic solution Commonly known as:  XALATAN Place 1 drop into the right eye at bedtime.      Allergies  Allergen Reactions  . Aspirin Other (See Comments)    unknown      The results of significant diagnostics from this hospitalization (including imaging, microbiology, ancillary and laboratory) are listed below for reference.    Significant Diagnostic Studies: Dg Chest 2 View  Result Date: 12/29/2015 CLINICAL DATA:  Altered mental state. EXAM: CHEST  2 VIEW COMPARISON:  July 29, 2009 FINDINGS: The heart size and mediastinal contours are within normal limits. Both lungs are clear. The visualized skeletal structures are unremarkable. IMPRESSION: No active cardiopulmonary disease. Electronically Signed   By: Dorise Bullion III M.D   On: 12/29/2015 22:12   Ct Head Wo Contrast  Result Date: 12/29/2015 CLINICAL DATA:  Confusion EXAM: CT HEAD WITHOUT CONTRAST TECHNIQUE: Contiguous axial images were obtained from the base of the skull through the vertex without intravenous contrast. COMPARISON:  None available FINDINGS: Brain: No evidence of acute infarction, hemorrhage, hydrocephalus, extra-axial collection or mass lesion/mass effect. Generalized atrophy. Chronic microvascular disease with confluent ischemic gliosis in the cerebral hemispheres. Vascular: No hyper dense vessel.  Atherosclerotic calcification. Skull: Negative Sinuses/Orbits: No acute finding. Mild mucosal thickening in the  paranasal sinuses. Left cataract resection. 5 mm nodule in the intraconal left orbital fat. IMPRESSION: 1. No acute finding. 2. Generalized atrophy and chronic microvascular disease. 3. 5 mm intraconal left orbital mass. Statistically this would reflect an incidental cavernous hemangioma, but metastasis or other mass cannot be excluded. If no outside imaging for comparison, could obtain follow-up if clinically appropriate. Electronically Signed   By: Monte Fantasia M.D.   On: 12/29/2015 22:42   Mr Jeri Cos [id number] Contrast  Result Date: 12/30/2015 CLINICAL DATA:  New onset confusion. History of stroke, recent general weakness. EXAM: MRI HEAD WITHOUT AND WITH CONTRAST TECHNIQUE: Multiplanar, multiecho pulse sequences of the brain and surrounding structures were obtained without and with intravenous contrast. CONTRAST:  62mL MULTIHANCE GADOBENATE DIMEGLUMINE 529 MG/ML IV SOLN COMPARISON:  CT HEAD December 29, 2015 FINDINGS: INTRACRANIAL CONTENTS: No reduced diffusion to suggest acute ischemia. No susceptibility artifact to suggest hemorrhage. Moderate ventriculomegaly on the basis of global parenchymal brain volume loss with cavum septum pellucidum et variegated. Confluent supratentorial and to lesser extent pontine white matter FLAIR T2 hyperintensities. No abnormal intraparenchymal or extra-axial enhancement. No masses, midline shift or  mass effect. No abnormal extra-axial fluid collections. Normal major intracranial vascular flow voids present at skull base. ORBITS: The included ocular globes and orbital contents are non-suspicious. Status post LEFT ocular lens implant. SINUSES: Mild pan paranasal sinus mucosal thickening. SKULL/SOFT TISSUES: No abnormal sellar expansion. No suspicious calvarial bone marrow signal. Craniocervical junction maintained. IMPRESSION: No acute intracranial process. Involutional changes. Moderate to severe chronic small vessel ischemic disease. Electronically Signed   By: Elon Alas  M.D.   On: 12/30/2015 18:26   US Abdomen Complete  Result Date: 12/30/2015 CLINICAL DATA:  Confusion, sepsis.  Increased creatinine. EXAM: ABDOMEN ULTRASOUND COMPLETE COMPARISON:  None. FINDINGS: Gallbladder: No gallstones or wall thickening visualized. No sonographic Murphy sign noted by sonographer. Common bile duct: Diameter: 3 mm Liver: No focal lesion identified. Within normal limits in parenchymal echogenicity. IVC: No abnormality visualized. Pancreas: Visualized portion unremarkable. The pancreatic tail was not seen due to overlying bowel gas. Spleen: Size and appearance within normal limits. Right Kidney: Length: 11.3 cm. Echogenicity is mildly increased. No solid mass or hydronephrosis visualized. There are 2 small renal cysts near the midportion of the kidney, the larger of which measures 1.5 x 1.4 x 1.3 cm. Left Kidney: Length: 10.8 cm. Echogenicity is mildly increased. No mass or hydronephrosis visualized. Abdominal aorta: No aneurysm visualized. Other findings: None. IMPRESSION: 1. Mildly increased echogenicity of the kidneys, which may be seen in the setting of medical renal disease. 2. No hydronephrosis or nephrolithiasis. 3. Otherwise unremarkable abdominal ultrasound. Electronically Signed   By: Ulyses Jarred M.D.   On: 12/30/2015 01:24    Microbiology: Recent Results (from the past 240 hour(s))  Blood culture (routine x 2)     Status: None (Preliminary result)   Collection Time: 12/29/15 11:00 PM  Result Value Ref Range Status   Specimen Description BLOOD RIGHT ANTECUBITAL  Final   Special Requests BOTTLES DRAWN AEROBIC AND ANAEROBIC 5CC   Final   Culture NO GROWTH < 24 HOURS  Final   Report Status PENDING  Incomplete     Labs: Basic Metabolic Panel:  Recent Labs Lab 12/29/15 2017 12/30/15 0756 12/31/15 0528  NA 135 141 140  K 3.7 3.5 4.0  CL 103 106 108  CO2 24 29 27   GLUCOSE 109* 116* 111*  BUN 22* 14 18  CREATININE 1.44* 1.16 1.08  CALCIUM 9.3 9.1 8.7*   Liver  Function Tests:  Recent Labs Lab 12/29/15 2017 12/30/15 0756  AST 50* 46*  ALT 23 21  ALKPHOS 46 42  BILITOT 1.5* 1.8*  PROT 7.3 6.9  ALBUMIN 3.9 3.7   No results for input(s): LIPASE, AMYLASE in the last 168 hours.  Recent Labs Lab 12/30/15 0246  AMMONIA 15   CBC:  Recent Labs Lab 12/29/15 2017 12/30/15 0756  WBC 5.9 4.3  NEUTROABS 4.6  --   HGB 12.6* 13.0  HCT 38.5* 39.5  MCV 94.6 93.6  PLT 157 140*   Cardiac Enzymes:  Recent Labs Lab 12/30/15 0244  TROPONINI <0.03   BNP: BNP (last 3 results) No results for input(s): BNP in the last 8760 hours.  ProBNP (last 3 results) No results for input(s): PROBNP in the last 8760 hours.  CBG:  Recent Labs Lab 12/30/15 1228 12/31/15 0820  GLUCAP 137* 99    Time spent: 32 minutes were spent in preparing this discharge including medication reconciliation, counseling, and coordination of care.  Signed:  Taisley Mordan Marry Guan  [facility] 12/31/2015, 12:40 PM

## 2015-12-31 NOTE — Progress Notes (Signed)
**  Preliminary report by tech**  Left lower extremity venous duplex completed No evidence of deep vein thrombosis involving the right lower extremity and left lower extremity. 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 There is an incidental finding of a Baker's cyst on the left.  12/31/15 11:30 AM Mario Mendez RVT

## 2015-12-31 NOTE — Care Management Note (Addendum)
Case Management Note  Patient Details  Name: Mario Mendez MRN: [id number] Date of Birth: [birth year]  Subjective/Objective:     CM following for progression and d/c planning.                Action/Plan: 12/31/2015 Met with pt re OBS status and d/c planning. Pt plans to d/c to home for friend , the address is: [address] [address] [place], [place]  Phone [phone] No DME needs identified at this time.  Pt has had HH in the past and selected [facility] for home health services. [place] notified.  Expected Discharge Date:  12/31/15               Expected Discharge Plan:  [place]  In-House Referral:  NA  Discharge planning Services  CM Consult  Post Acute Care Choice:  NA Choice offered to:  Patient  DME Arranged:    DME Agency:     HH Arranged:  PT, OT, Social Work [employer] Agency:  [facility]  Status of Service:  Completed, signed off  If discussed at [employer], dates discussed:    Additional Comments:  Adron Bene, RN 12/31/2015, 11:09 AM

## 2015-12-31 NOTE — Care Management Obs Status (Signed)
MEDICARE OBSERVATION STATUS NOTIFICATION   Patient Details  Name: Mario Mendez MRN: [id number] Date of Birth: March 31, 1924   Medicare Observation Status Notification Given:  Yes    Kehaulani Fruin, Rory Percy, RN 12/31/2015, 11:08 AM

## 2016-01-03 LAB — CULTURE, BLOOD (ROUTINE X 2): Culture: NO GROWTH

## 2016-01-04 LAB — CULTURE, BLOOD (ROUTINE X 2): Culture: NO GROWTH

## 2016-08-18 DIAGNOSIS — Z8546 Personal history of malignant neoplasm of prostate: Secondary | ICD-10-CM | POA: Diagnosis not present

## 2016-09-24 IMAGING — MR MR HEAD WO/W CM
10 of 12 series · 34 of 48 positions shown · IV contrast (multihance)
Comparison: CT HEAD December 29, 2015

CLINICAL DATA: New onset confusion. History of stroke, recent
general weakness.

EXAM:
MRI HEAD WITHOUT AND WITH CONTRAST
TECHNIQUE: Multiplanar, multiecho pulse sequences of the brain and surrounding
structures were obtained without and with intravenous contrast.
CONTRAST:  15mL MULTIHANCE GADOBENATE DIMEGLUMINE 529 MG/ML IV SOLN

[Series 3: DWI · axial · 3.0mm · 1.09mm/px · z∈[-45,+86]mm · 9 of 90 slices shown (1 of 4)]
[im 1/90]
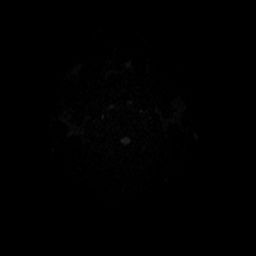
[im 12/90]
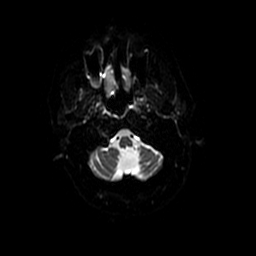
[im 23/90]
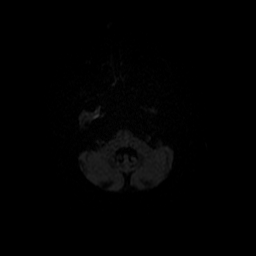
[im 34/90]
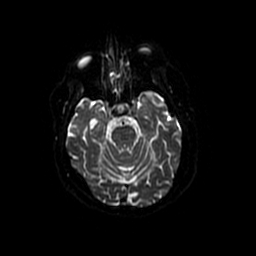
[im 45/90]
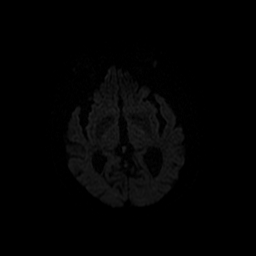
[im 56/90]
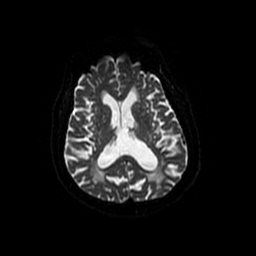
[im 67/90]
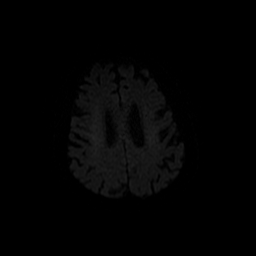
[im 78/90]
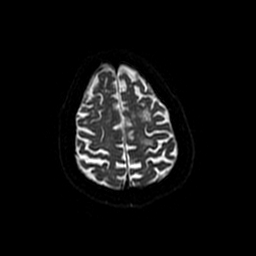
[im 90/90]
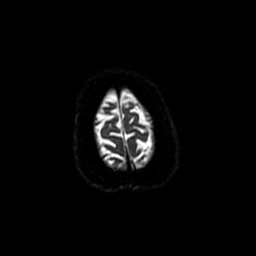

[Series 4: T1 · sagittal · 5.0mm · 0.47mm/px · 3 of 23 slices shown]
[im 1/23]
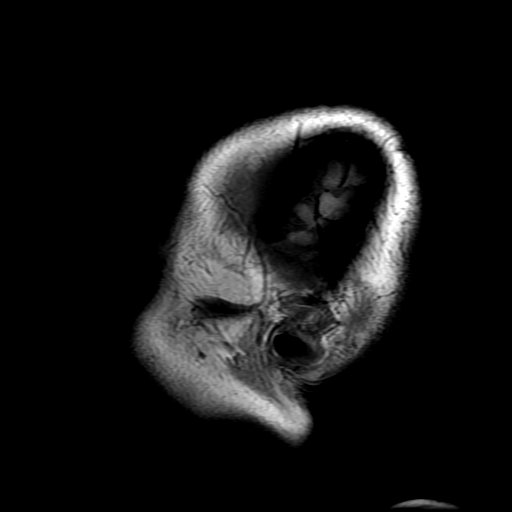
[im 12/23]
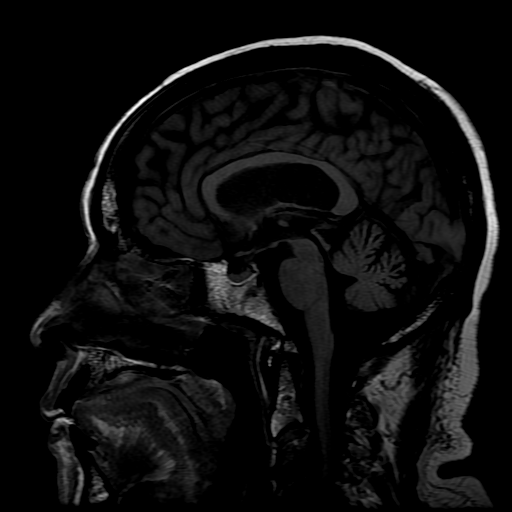
[im 23/23]
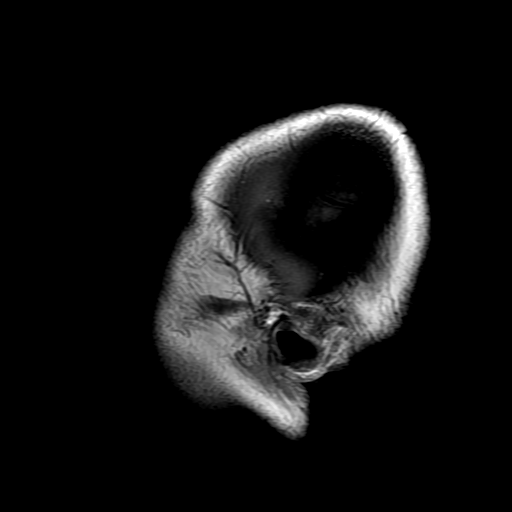

[Series 5: T2 · axial · 5.0mm · 0.43mm/px · z∈[-40,+91]mm · 2 of 23 slices shown]
[im 1/23]
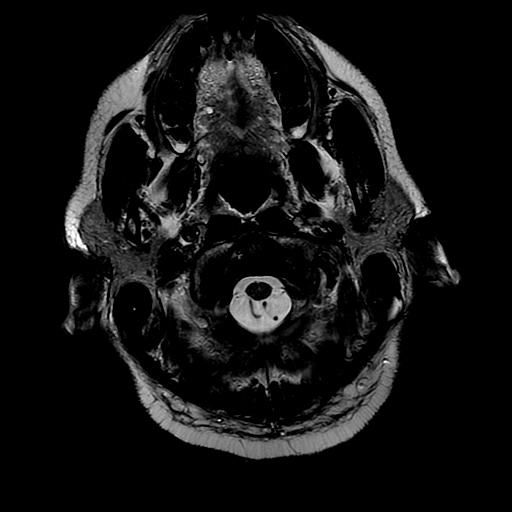
[im 23/23]
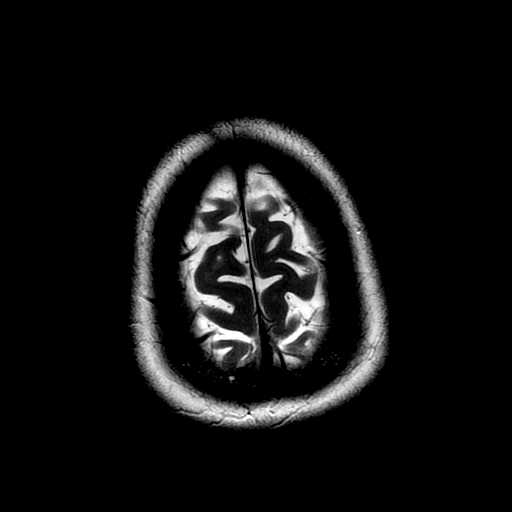

[Series 6: DWI · coronal · 5.0mm · 1.09mm/px · 6 of 64 slices shown (2 of 4)]
[im 1/64]
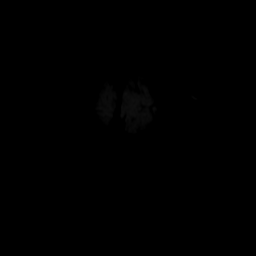
[im 13/64]
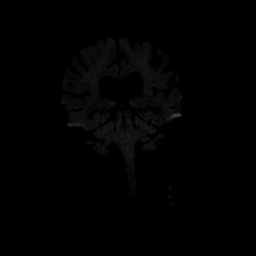
[im 26/64]
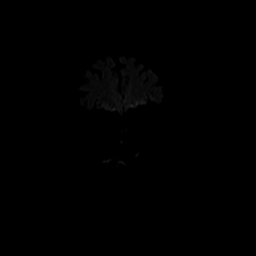
[im 38/64]
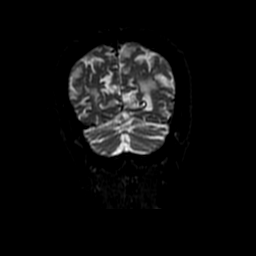
[im 51/64]
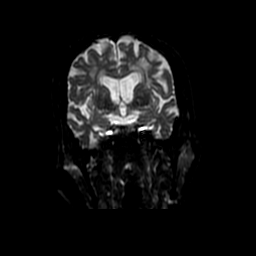
[im 64/64]
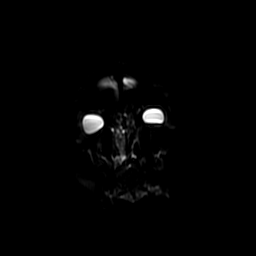

[Series 7: FLAIR · axial · 5.0mm · 0.43mm/px · z∈[-40,+91]mm · 2 of 23 slices shown]
[im 1/23]
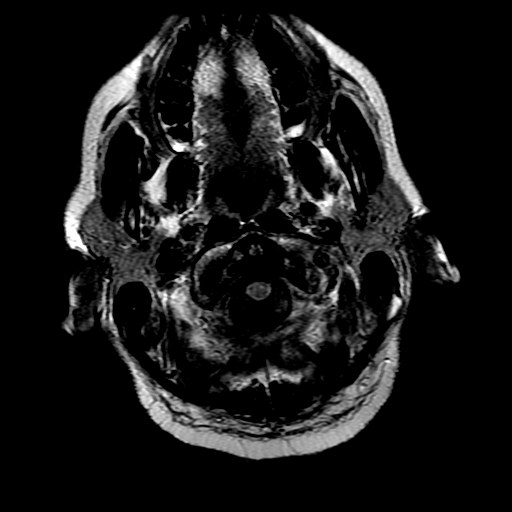
[im 23/23]
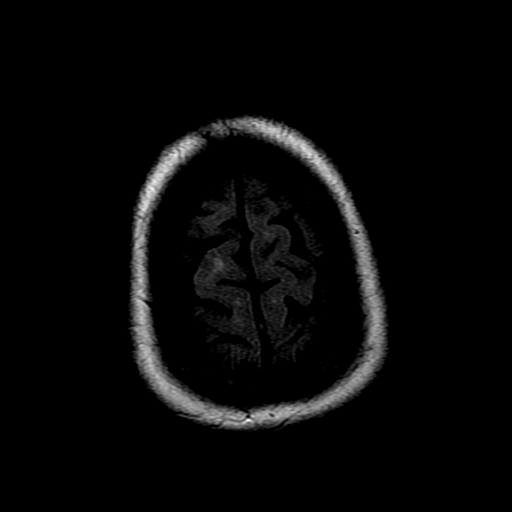

[Series 8: ax mpgr · axial · 5.0mm · 0.43mm/px · 1 of 23 slices shown]
[im 1/23]
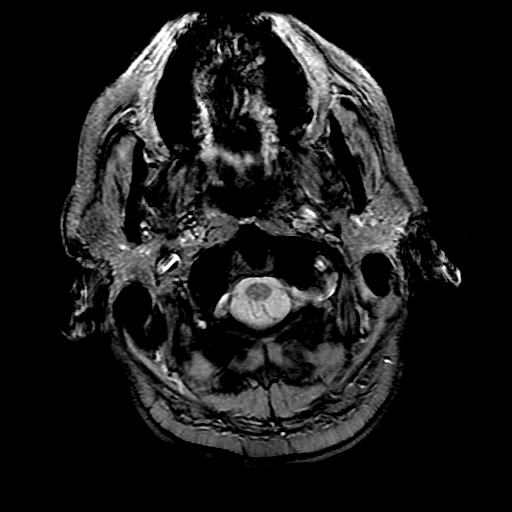

[Series 10: T1 post-contrast · coronal · 5.0mm · 0.39mm/px · 2 of 25 slices shown]
[im 1/25]
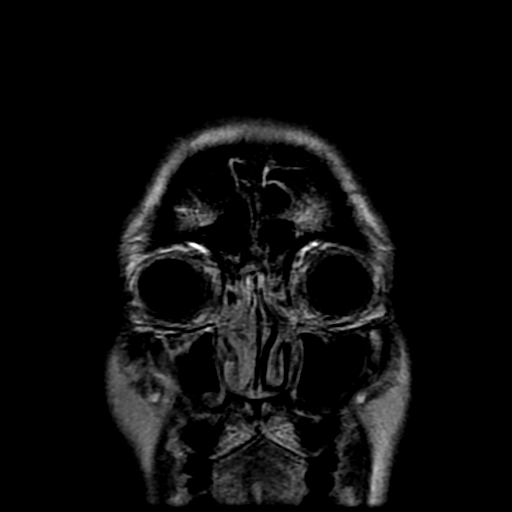
[im 25/25]
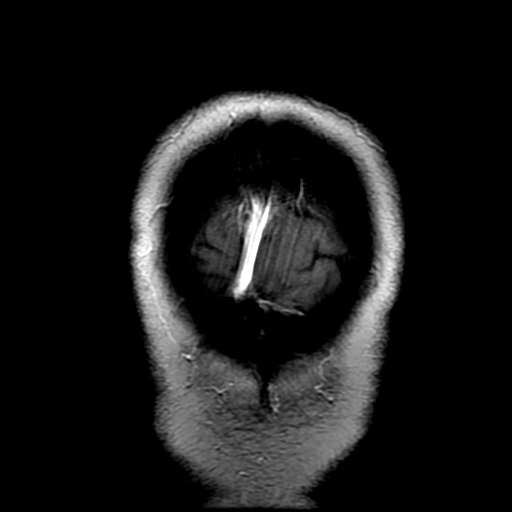

[Series 11: T2 post-contrast · coronal · 5.0mm · 0.39mm/px · 2 of 25 slices shown]
[im 1/25]
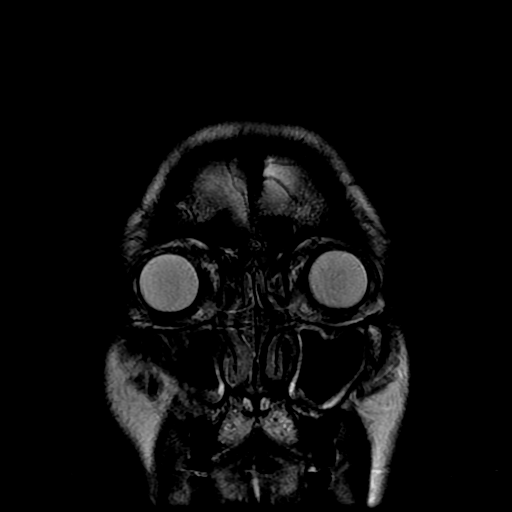
[im 25/25]
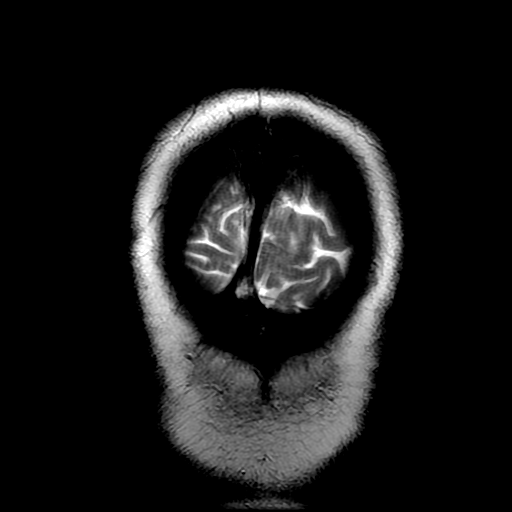

[Series 300: DWI · axial · 3.0mm · 1.09mm/px · z∈[-45,+86]mm · 4 of 45 slices shown (3 of 4)]
[im 1/45]
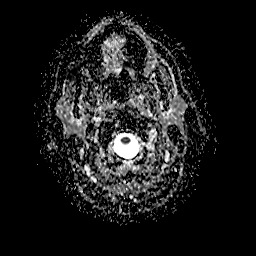
[im 15/45]
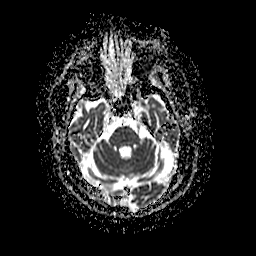
[im 30/45]
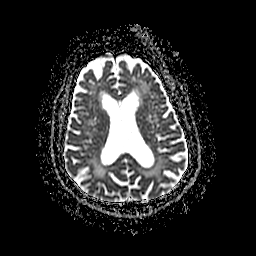
[im 45/45]
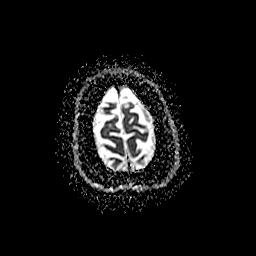

[Series 600: DWI · coronal · 5.0mm · 1.09mm/px · 3 of 32 slices shown (4 of 4)]
[im 1/32]
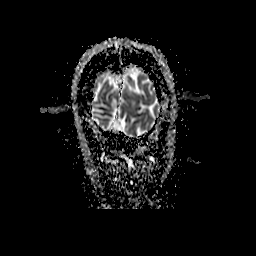
[im 16/32]
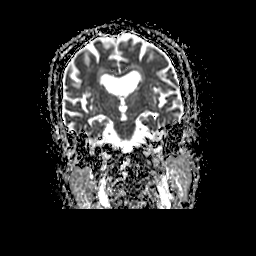
[im 32/32]
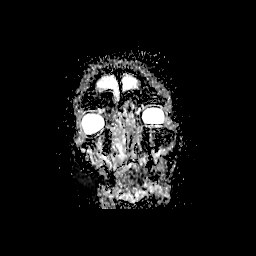

[34 of 48 positions shown; findings below may reference images not displayed]

FINDINGS: INTRACRANIAL CONTENTS: No reduced diffusion to suggest acute
ischemia. No susceptibility artifact to suggest hemorrhage. Moderate
ventriculomegaly on the basis of global parenchymal brain volume
loss with cavum septum pellucidum et variegated. Confluent
supratentorial and to lesser extent pontine white matter FLAIR T2
hyperintensities. No abnormal intraparenchymal or extra-axial
enhancement. No masses, midline shift or mass effect. No abnormal
extra-axial fluid collections. Normal major intracranial vascular
flow voids present at skull base.

ORBITS: The included ocular globes and orbital contents are
non-suspicious. Status post LEFT ocular lens implant.

SINUSES: Mild pan paranasal sinus mucosal thickening.

SKULL/SOFT TISSUES: No abnormal sellar expansion. No suspicious
calvarial bone marrow signal. Craniocervical junction maintained.
IMPRESSION: No acute intracranial process.

Involutional changes. Moderate to severe chronic small vessel
ischemic disease.

## 2017-06-24 ENCOUNTER — Other Ambulatory Visit: Payer: Self-pay | Admitting: *Deleted

## 2017-06-24 NOTE — Patient Outreach (Signed)
[facility]) Care Management  06/24/2017  Mario Mendez 16-Mar-1924 [id number]  Referral via MD office; Reason-Assess functionality; 84 year old, lives alone, some cognitive impairment; ? Needs assistance:  Telephone call to listed home phone which rings with no answer; unable to leave message. Call made to alternate number; call forwarded to another number; HIPPA compliant message left requesting call back.  Plan: Will follow up.  [name], RN BSN [place] Management Coordinator [facility] Care Management  [phone]

## 2017-06-25 ENCOUNTER — Other Ambulatory Visit: Payer: Self-pay | Admitting: *Deleted

## 2017-06-25 ENCOUNTER — Encounter: Payer: Self-pay | Admitting: *Deleted

## 2017-06-25 NOTE — Patient Outreach (Signed)
[facility]) Care Management  06/25/2017  Mario Mendez May 07, 1924 [id number]  Referral via MD office; Reason-Assess functionality; 84 year old, lives alone, some cognitive impairment; ? Needs assistance  Telephone call attempt x 2; phone rings with no answer; unable to leave message.   Plan: Geophysicist/field seismologist.  Will follow up.  [name], RN BSN [place] Management Coordinator [facility] Care Management  [phone]

## 2017-07-08 ENCOUNTER — Other Ambulatory Visit: Payer: Self-pay | Admitting: *Deleted

## 2017-07-08 ENCOUNTER — Encounter: Payer: Self-pay | Admitting: *Deleted

## 2017-07-08 NOTE — Patient Outreach (Signed)
[facility]) Care Management  07/08/2017  Mainor Hellmann 1923/06/29 [id number]  Telephone call x 3 to patient, no answer; unable to leave message.  No response to outreach letter.   Plan: Send MD closure letter. Send to care management for case closure.   [name], RN BSN [place] Management Coordinator [facility] 2 Care Management  [phone]

## 2019-08-02 DIAGNOSIS — H353131 Nonexudative age-related macular degeneration, bilateral, early dry stage: Secondary | ICD-10-CM | POA: Diagnosis not present

## 2019-08-02 DIAGNOSIS — H401121 Primary open-angle glaucoma, left eye, mild stage: Secondary | ICD-10-CM | POA: Diagnosis not present

## 2019-08-02 DIAGNOSIS — H25811 Combined forms of age-related cataract, right eye: Secondary | ICD-10-CM | POA: Diagnosis not present

## 2019-08-02 DIAGNOSIS — H401112 Primary open-angle glaucoma, right eye, moderate stage: Secondary | ICD-10-CM | POA: Diagnosis not present

## 2019-08-02 DIAGNOSIS — Z961 Presence of intraocular lens: Secondary | ICD-10-CM | POA: Diagnosis not present

## 2019-08-02 DIAGNOSIS — H04123 Dry eye syndrome of bilateral lacrimal glands: Secondary | ICD-10-CM | POA: Diagnosis not present

## 2019-10-20 DIAGNOSIS — C61 Malignant neoplasm of prostate: Secondary | ICD-10-CM | POA: Diagnosis not present

## 2019-10-30 DIAGNOSIS — Z8546 Personal history of malignant neoplasm of prostate: Secondary | ICD-10-CM | POA: Diagnosis not present

## 2019-11-30 DIAGNOSIS — I10 Essential (primary) hypertension: Secondary | ICD-10-CM | POA: Diagnosis not present

## 2019-11-30 DIAGNOSIS — I872 Venous insufficiency (chronic) (peripheral): Secondary | ICD-10-CM | POA: Diagnosis not present

## 2019-11-30 DIAGNOSIS — Z1389 Encounter for screening for other disorder: Secondary | ICD-10-CM | POA: Diagnosis not present

## 2019-11-30 DIAGNOSIS — Z0000 Encounter for general adult medical examination without abnormal findings: Secondary | ICD-10-CM | POA: Diagnosis not present

## 2020-01-31 DIAGNOSIS — H401112 Primary open-angle glaucoma, right eye, moderate stage: Secondary | ICD-10-CM | POA: Diagnosis not present

## 2020-01-31 DIAGNOSIS — H401121 Primary open-angle glaucoma, left eye, mild stage: Secondary | ICD-10-CM | POA: Diagnosis not present

## 2020-06-14 DIAGNOSIS — I10 Essential (primary) hypertension: Secondary | ICD-10-CM | POA: Diagnosis not present

## 2020-06-14 DIAGNOSIS — I872 Venous insufficiency (chronic) (peripheral): Secondary | ICD-10-CM | POA: Diagnosis not present

## 2020-07-19 DIAGNOSIS — H6123 Impacted cerumen, bilateral: Secondary | ICD-10-CM | POA: Diagnosis not present

## 2020-07-28 DIAGNOSIS — E46 Unspecified protein-calorie malnutrition: Secondary | ICD-10-CM | POA: Diagnosis not present

## 2020-07-28 DIAGNOSIS — K5900 Constipation, unspecified: Secondary | ICD-10-CM | POA: Diagnosis not present

## 2020-07-28 DIAGNOSIS — M1712 Unilateral primary osteoarthritis, left knee: Secondary | ICD-10-CM | POA: Diagnosis not present

## 2020-07-28 DIAGNOSIS — I471 Supraventricular tachycardia: Secondary | ICD-10-CM | POA: Diagnosis not present

## 2020-07-28 DIAGNOSIS — I10 Essential (primary) hypertension: Secondary | ICD-10-CM | POA: Diagnosis not present

## 2020-07-28 DIAGNOSIS — I8390 Asymptomatic varicose veins of unspecified lower extremity: Secondary | ICD-10-CM | POA: Diagnosis not present

## 2020-07-28 DIAGNOSIS — R32 Unspecified urinary incontinence: Secondary | ICD-10-CM | POA: Diagnosis not present

## 2020-07-28 DIAGNOSIS — K2289 Other specified disease of esophagus: Secondary | ICD-10-CM | POA: Diagnosis not present

## 2020-07-28 DIAGNOSIS — H409 Unspecified glaucoma: Secondary | ICD-10-CM | POA: Diagnosis not present

## 2020-08-01 DIAGNOSIS — I471 Supraventricular tachycardia: Secondary | ICD-10-CM | POA: Diagnosis not present

## 2020-08-01 DIAGNOSIS — E46 Unspecified protein-calorie malnutrition: Secondary | ICD-10-CM | POA: Diagnosis not present

## 2020-08-01 DIAGNOSIS — I10 Essential (primary) hypertension: Secondary | ICD-10-CM | POA: Diagnosis not present

## 2020-08-01 DIAGNOSIS — H409 Unspecified glaucoma: Secondary | ICD-10-CM | POA: Diagnosis not present

## 2020-08-01 DIAGNOSIS — I8390 Asymptomatic varicose veins of unspecified lower extremity: Secondary | ICD-10-CM | POA: Diagnosis not present

## 2020-08-01 DIAGNOSIS — K5900 Constipation, unspecified: Secondary | ICD-10-CM | POA: Diagnosis not present

## 2020-08-01 DIAGNOSIS — K2289 Other specified disease of esophagus: Secondary | ICD-10-CM | POA: Diagnosis not present

## 2020-08-01 DIAGNOSIS — M1712 Unilateral primary osteoarthritis, left knee: Secondary | ICD-10-CM | POA: Diagnosis not present

## 2020-08-01 DIAGNOSIS — R32 Unspecified urinary incontinence: Secondary | ICD-10-CM | POA: Diagnosis not present

## 2020-08-06 DIAGNOSIS — K5900 Constipation, unspecified: Secondary | ICD-10-CM | POA: Diagnosis not present

## 2020-08-06 DIAGNOSIS — I10 Essential (primary) hypertension: Secondary | ICD-10-CM | POA: Diagnosis not present

## 2020-08-06 DIAGNOSIS — R32 Unspecified urinary incontinence: Secondary | ICD-10-CM | POA: Diagnosis not present

## 2020-08-06 DIAGNOSIS — H409 Unspecified glaucoma: Secondary | ICD-10-CM | POA: Diagnosis not present

## 2020-08-06 DIAGNOSIS — I8390 Asymptomatic varicose veins of unspecified lower extremity: Secondary | ICD-10-CM | POA: Diagnosis not present

## 2020-08-06 DIAGNOSIS — I471 Supraventricular tachycardia: Secondary | ICD-10-CM | POA: Diagnosis not present

## 2020-08-06 DIAGNOSIS — E46 Unspecified protein-calorie malnutrition: Secondary | ICD-10-CM | POA: Diagnosis not present

## 2020-08-06 DIAGNOSIS — K2289 Other specified disease of esophagus: Secondary | ICD-10-CM | POA: Diagnosis not present

## 2020-08-06 DIAGNOSIS — M1712 Unilateral primary osteoarthritis, left knee: Secondary | ICD-10-CM | POA: Diagnosis not present

## 2020-08-08 DIAGNOSIS — I471 Supraventricular tachycardia: Secondary | ICD-10-CM | POA: Diagnosis not present

## 2020-08-08 DIAGNOSIS — K2289 Other specified disease of esophagus: Secondary | ICD-10-CM | POA: Diagnosis not present

## 2020-08-08 DIAGNOSIS — M1712 Unilateral primary osteoarthritis, left knee: Secondary | ICD-10-CM | POA: Diagnosis not present

## 2020-08-08 DIAGNOSIS — I8390 Asymptomatic varicose veins of unspecified lower extremity: Secondary | ICD-10-CM | POA: Diagnosis not present

## 2020-08-08 DIAGNOSIS — I10 Essential (primary) hypertension: Secondary | ICD-10-CM | POA: Diagnosis not present

## 2020-08-08 DIAGNOSIS — H409 Unspecified glaucoma: Secondary | ICD-10-CM | POA: Diagnosis not present

## 2020-08-08 DIAGNOSIS — R32 Unspecified urinary incontinence: Secondary | ICD-10-CM | POA: Diagnosis not present

## 2020-08-08 DIAGNOSIS — K5900 Constipation, unspecified: Secondary | ICD-10-CM | POA: Diagnosis not present

## 2020-08-08 DIAGNOSIS — E46 Unspecified protein-calorie malnutrition: Secondary | ICD-10-CM | POA: Diagnosis not present

## 2020-08-09 DIAGNOSIS — I471 Supraventricular tachycardia: Secondary | ICD-10-CM | POA: Diagnosis not present

## 2020-08-09 DIAGNOSIS — K5900 Constipation, unspecified: Secondary | ICD-10-CM | POA: Diagnosis not present

## 2020-08-09 DIAGNOSIS — I10 Essential (primary) hypertension: Secondary | ICD-10-CM | POA: Diagnosis not present

## 2020-08-09 DIAGNOSIS — H409 Unspecified glaucoma: Secondary | ICD-10-CM | POA: Diagnosis not present

## 2020-08-09 DIAGNOSIS — M1712 Unilateral primary osteoarthritis, left knee: Secondary | ICD-10-CM | POA: Diagnosis not present

## 2020-08-09 DIAGNOSIS — I8390 Asymptomatic varicose veins of unspecified lower extremity: Secondary | ICD-10-CM | POA: Diagnosis not present

## 2020-08-09 DIAGNOSIS — R32 Unspecified urinary incontinence: Secondary | ICD-10-CM | POA: Diagnosis not present

## 2020-08-09 DIAGNOSIS — E46 Unspecified protein-calorie malnutrition: Secondary | ICD-10-CM | POA: Diagnosis not present

## 2020-08-09 DIAGNOSIS — K2289 Other specified disease of esophagus: Secondary | ICD-10-CM | POA: Diagnosis not present

## 2020-08-14 DIAGNOSIS — R32 Unspecified urinary incontinence: Secondary | ICD-10-CM | POA: Diagnosis not present

## 2020-08-14 DIAGNOSIS — E46 Unspecified protein-calorie malnutrition: Secondary | ICD-10-CM | POA: Diagnosis not present

## 2020-08-14 DIAGNOSIS — I10 Essential (primary) hypertension: Secondary | ICD-10-CM | POA: Diagnosis not present

## 2020-08-14 DIAGNOSIS — K2289 Other specified disease of esophagus: Secondary | ICD-10-CM | POA: Diagnosis not present

## 2020-08-14 DIAGNOSIS — I8390 Asymptomatic varicose veins of unspecified lower extremity: Secondary | ICD-10-CM | POA: Diagnosis not present

## 2020-08-14 DIAGNOSIS — M1712 Unilateral primary osteoarthritis, left knee: Secondary | ICD-10-CM | POA: Diagnosis not present

## 2020-08-14 DIAGNOSIS — I471 Supraventricular tachycardia: Secondary | ICD-10-CM | POA: Diagnosis not present

## 2020-08-14 DIAGNOSIS — K5900 Constipation, unspecified: Secondary | ICD-10-CM | POA: Diagnosis not present

## 2020-08-14 DIAGNOSIS — H409 Unspecified glaucoma: Secondary | ICD-10-CM | POA: Diagnosis not present

## 2020-08-15 DIAGNOSIS — H409 Unspecified glaucoma: Secondary | ICD-10-CM | POA: Diagnosis not present

## 2020-08-15 DIAGNOSIS — E46 Unspecified protein-calorie malnutrition: Secondary | ICD-10-CM | POA: Diagnosis not present

## 2020-08-15 DIAGNOSIS — I471 Supraventricular tachycardia: Secondary | ICD-10-CM | POA: Diagnosis not present

## 2020-08-15 DIAGNOSIS — K5900 Constipation, unspecified: Secondary | ICD-10-CM | POA: Diagnosis not present

## 2020-08-15 DIAGNOSIS — K2289 Other specified disease of esophagus: Secondary | ICD-10-CM | POA: Diagnosis not present

## 2020-08-15 DIAGNOSIS — I10 Essential (primary) hypertension: Secondary | ICD-10-CM | POA: Diagnosis not present

## 2020-08-15 DIAGNOSIS — M1712 Unilateral primary osteoarthritis, left knee: Secondary | ICD-10-CM | POA: Diagnosis not present

## 2020-08-15 DIAGNOSIS — R32 Unspecified urinary incontinence: Secondary | ICD-10-CM | POA: Diagnosis not present

## 2020-08-15 DIAGNOSIS — I8390 Asymptomatic varicose veins of unspecified lower extremity: Secondary | ICD-10-CM | POA: Diagnosis not present

## 2020-08-16 DIAGNOSIS — I8390 Asymptomatic varicose veins of unspecified lower extremity: Secondary | ICD-10-CM | POA: Diagnosis not present

## 2020-08-16 DIAGNOSIS — K2289 Other specified disease of esophagus: Secondary | ICD-10-CM | POA: Diagnosis not present

## 2020-08-16 DIAGNOSIS — H409 Unspecified glaucoma: Secondary | ICD-10-CM | POA: Diagnosis not present

## 2020-08-16 DIAGNOSIS — R32 Unspecified urinary incontinence: Secondary | ICD-10-CM | POA: Diagnosis not present

## 2020-08-16 DIAGNOSIS — K5900 Constipation, unspecified: Secondary | ICD-10-CM | POA: Diagnosis not present

## 2020-08-16 DIAGNOSIS — I10 Essential (primary) hypertension: Secondary | ICD-10-CM | POA: Diagnosis not present

## 2020-08-16 DIAGNOSIS — I471 Supraventricular tachycardia: Secondary | ICD-10-CM | POA: Diagnosis not present

## 2020-08-16 DIAGNOSIS — E46 Unspecified protein-calorie malnutrition: Secondary | ICD-10-CM | POA: Diagnosis not present

## 2020-08-16 DIAGNOSIS — M1712 Unilateral primary osteoarthritis, left knee: Secondary | ICD-10-CM | POA: Diagnosis not present

## 2020-08-20 DIAGNOSIS — E46 Unspecified protein-calorie malnutrition: Secondary | ICD-10-CM | POA: Diagnosis not present

## 2020-08-20 DIAGNOSIS — K5900 Constipation, unspecified: Secondary | ICD-10-CM | POA: Diagnosis not present

## 2020-08-20 DIAGNOSIS — I8390 Asymptomatic varicose veins of unspecified lower extremity: Secondary | ICD-10-CM | POA: Diagnosis not present

## 2020-08-20 DIAGNOSIS — H409 Unspecified glaucoma: Secondary | ICD-10-CM | POA: Diagnosis not present

## 2020-08-20 DIAGNOSIS — I471 Supraventricular tachycardia: Secondary | ICD-10-CM | POA: Diagnosis not present

## 2020-08-20 DIAGNOSIS — R32 Unspecified urinary incontinence: Secondary | ICD-10-CM | POA: Diagnosis not present

## 2020-08-20 DIAGNOSIS — I10 Essential (primary) hypertension: Secondary | ICD-10-CM | POA: Diagnosis not present

## 2020-08-20 DIAGNOSIS — M1712 Unilateral primary osteoarthritis, left knee: Secondary | ICD-10-CM | POA: Diagnosis not present

## 2020-08-20 DIAGNOSIS — K2289 Other specified disease of esophagus: Secondary | ICD-10-CM | POA: Diagnosis not present

## 2020-08-22 DIAGNOSIS — M1712 Unilateral primary osteoarthritis, left knee: Secondary | ICD-10-CM | POA: Diagnosis not present

## 2020-08-22 DIAGNOSIS — K2289 Other specified disease of esophagus: Secondary | ICD-10-CM | POA: Diagnosis not present

## 2020-08-22 DIAGNOSIS — R32 Unspecified urinary incontinence: Secondary | ICD-10-CM | POA: Diagnosis not present

## 2020-08-22 DIAGNOSIS — K5900 Constipation, unspecified: Secondary | ICD-10-CM | POA: Diagnosis not present

## 2020-08-22 DIAGNOSIS — I471 Supraventricular tachycardia: Secondary | ICD-10-CM | POA: Diagnosis not present

## 2020-08-22 DIAGNOSIS — I8390 Asymptomatic varicose veins of unspecified lower extremity: Secondary | ICD-10-CM | POA: Diagnosis not present

## 2020-08-22 DIAGNOSIS — E46 Unspecified protein-calorie malnutrition: Secondary | ICD-10-CM | POA: Diagnosis not present

## 2020-08-22 DIAGNOSIS — I10 Essential (primary) hypertension: Secondary | ICD-10-CM | POA: Diagnosis not present

## 2020-08-22 DIAGNOSIS — H409 Unspecified glaucoma: Secondary | ICD-10-CM | POA: Diagnosis not present

## 2020-08-26 DIAGNOSIS — I499 Cardiac arrhythmia, unspecified: Secondary | ICD-10-CM | POA: Diagnosis not present

## 2020-08-26 DIAGNOSIS — Z743 Need for continuous supervision: Secondary | ICD-10-CM | POA: Diagnosis not present

## 2020-09-08 DIAGNOSIS — 419620001 Death: Secondary | SNOMED CT | POA: Diagnosis not present

## 2020-09-08 DEATH — deceased
